# Patient Record
Sex: Female | Born: 1965 | Race: White | Hispanic: No | Smoking: Current every day smoker
Health system: Southern US, Community
[De-identification: ages and names within clinical notes are randomized; demographics above are authoritative.]

## PROBLEM LIST (undated history)

## (undated) DIAGNOSIS — Z789 Other specified health status: Secondary | ICD-10-CM

## (undated) DIAGNOSIS — F329 Major depressive disorder, single episode, unspecified: Secondary | ICD-10-CM

## (undated) DIAGNOSIS — F32A Depression, unspecified: Secondary | ICD-10-CM

## (undated) DIAGNOSIS — F419 Anxiety disorder, unspecified: Secondary | ICD-10-CM

## (undated) HISTORY — DX: Depression, unspecified: F32.A

## (undated) HISTORY — PX: DIAGNOSTIC LAPAROSCOPY: SUR761

## (undated) HISTORY — PX: TUBAL LIGATION: SHX77

## (undated) HISTORY — DX: Anxiety disorder, unspecified: F41.9

---
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
## 1997-10-16 ENCOUNTER — Inpatient Hospital Stay (HOSPITAL_COMMUNITY): Admission: AD | Admit: 1997-10-16 | Discharge: 1997-10-16 | Payer: Self-pay | Admitting: Obstetrics and Gynecology

## 1997-12-27 ENCOUNTER — Ambulatory Visit (HOSPITAL_COMMUNITY): Admission: RE | Admit: 1997-12-27 | Discharge: 1997-12-27 | Payer: Self-pay | Admitting: Obstetrics and Gynecology

## 1997-12-31 ENCOUNTER — Inpatient Hospital Stay (HOSPITAL_COMMUNITY): Admission: AD | Admit: 1997-12-31 | Discharge: 1998-01-04 | Payer: Self-pay | Admitting: Obstetrics and Gynecology

## 1999-02-24 ENCOUNTER — Other Ambulatory Visit: Admission: RE | Admit: 1999-02-24 | Discharge: 1999-02-24 | Payer: Self-pay | Admitting: Obstetrics and Gynecology

## 2000-03-08 ENCOUNTER — Other Ambulatory Visit: Admission: RE | Admit: 2000-03-08 | Discharge: 2000-03-08 | Payer: Self-pay | Admitting: Obstetrics and Gynecology

## 2001-04-11 ENCOUNTER — Other Ambulatory Visit: Admission: RE | Admit: 2001-04-11 | Discharge: 2001-04-11 | Payer: Self-pay | Admitting: Obstetrics and Gynecology

## 2002-04-24 ENCOUNTER — Other Ambulatory Visit: Admission: RE | Admit: 2002-04-24 | Discharge: 2002-04-24 | Payer: Self-pay | Admitting: Obstetrics and Gynecology

## 2003-05-07 ENCOUNTER — Other Ambulatory Visit: Admission: RE | Admit: 2003-05-07 | Discharge: 2003-05-07 | Payer: Self-pay | Admitting: Obstetrics and Gynecology

## 2004-05-05 ENCOUNTER — Other Ambulatory Visit: Admission: RE | Admit: 2004-05-05 | Discharge: 2004-05-05 | Payer: Self-pay | Admitting: Obstetrics and Gynecology

## 2004-07-13 ENCOUNTER — Ambulatory Visit (HOSPITAL_COMMUNITY): Admission: RE | Admit: 2004-07-13 | Discharge: 2004-07-13 | Payer: Self-pay | Admitting: Obstetrics and Gynecology

## 2005-05-14 ENCOUNTER — Other Ambulatory Visit: Admission: RE | Admit: 2005-05-14 | Discharge: 2005-05-14 | Payer: Self-pay | Admitting: Obstetrics and Gynecology

## 2010-10-17 ENCOUNTER — Other Ambulatory Visit: Payer: Self-pay | Admitting: Obstetrics and Gynecology

## 2010-10-17 DIAGNOSIS — R928 Other abnormal and inconclusive findings on diagnostic imaging of breast: Secondary | ICD-10-CM

## 2010-10-24 ENCOUNTER — Ambulatory Visit
Admission: RE | Admit: 2010-10-24 | Discharge: 2010-10-24 | Disposition: A | Discharge status: Home / Self Care | Payer: Managed Care, Other (non HMO) | Source: Ambulatory Visit | Attending: Obstetrics and Gynecology | Admitting: Obstetrics and Gynecology

## 2010-10-24 ENCOUNTER — Other Ambulatory Visit: Payer: Self-pay | Admitting: Obstetrics and Gynecology

## 2010-10-24 DIAGNOSIS — R928 Other abnormal and inconclusive findings on diagnostic imaging of breast: Secondary | ICD-10-CM

## 2012-04-26 ENCOUNTER — Encounter (HOSPITAL_COMMUNITY): Payer: Self-pay | Admitting: Pharmacist

## 2012-05-05 NOTE — H&P (Signed)
Terri Lee, Terri Lee                 ACCOUNT NO.:  [id number]  MEDICAL RECORD NO.:  [id number]  LOCATION:  PERIO                         FACILITY:  [facility]  PHYSICIAN:  Duke Salvia. Marcelle Overlie, M.D.DATE OF BIRTH:  1965/09/07  DATE OF ADMISSION:  04/07/2012 DATE OF DISCHARGE:                             HISTORY & PHYSICAL   CHIEF COMPLAINT:  Menorrhagia, endometrial polyp.  HISTORY OF PRESENT ILLNESS:  A 46 year old, G1, P1, prior tubal was evaluated recently with complaints of heavy menstrual flow, FHT performed in our office April 03, 2012, demonstrated a retroverted uterus, very small 9 mm intramural fibroid, but there was also a well- defined 14 x 6 mm polyp.  Presents now for D and C, hysteroscopy with removal of the polyp.  This procedure including risks related to bleeding, infection, other complications may require additional surgery discussed with her which she understands and accepts.  PAST MEDICAL HISTORY:  Current medications:  Prozac.  Allergies:  None.  SURGICAL HISTORY:  C-section in 1999, tubal ligation in 2005.  FAMILY HISTORY:  Significant for diabetes and unspecified cancer.  SOCIAL HISTORY:  Does smoke 1/2 PPD.  Denies drug or alcohol use.  She is married.  Her PCP is Biochemist, clinical at [employer].  PHYSICAL EXAMINATION:  VITAL SIGNS:  Temperature 98.2, blood pressure 120/72. HEENT:  Unremarkable. NECK:  Supple without masses. LUNGS:  Clear. CARDIOVASCULAR:  Regular rate and rhythm without murmurs, rubs, gallops. BREASTS:  Without masses. ABDOMEN:  Soft, flat, nontender. PELVIC:  Normal external genitalia.  Vagina and cervix clear.  Uterus, mid positional size.  Adnexa negative. EXTREMITIES AND NEUROLOGIC:  Unremarkable.  IMPRESSION:  Menorrhagia, endometrial polyp.  PLAN:  D and C, hysteroscopy.  Procedure and risks discussed as above.     [name] M. Marcelle Overlie, M.D.     RM[facility]/MEDQ  D:  05/05/2012  T:  05/05/2012  Job:  [id number]

## 2012-05-05 NOTE — H&P (Signed)
Terri Lee  DICTATION # [id number] CSN# [id number]   Meriel Pica, MD 05/05/2012 9:36 AM

## 2012-05-06 ENCOUNTER — Encounter (HOSPITAL_COMMUNITY)
Admission: RE | Admit: 2012-05-06 | Discharge: 2012-05-06 | Disposition: A | Discharge status: Home / Self Care | Payer: Managed Care, Other (non HMO) | Source: Ambulatory Visit | Attending: Obstetrics and Gynecology | Admitting: Obstetrics and Gynecology

## 2012-05-06 ENCOUNTER — Encounter (HOSPITAL_COMMUNITY): Payer: Self-pay

## 2012-05-06 HISTORY — DX: Other specified health status: Z78.9

## 2012-05-06 LAB — CBC
MCH: 28.5 pg (ref 26.0–34.0)
MCHC: 33.6 g/dL (ref 30.0–36.0)
MCV: 84.9 fL (ref 78.0–100.0)
Platelets: 225 10*3/uL (ref 150–400)
RBC: 4.7 MIL/uL (ref 3.87–5.11)

## 2012-05-06 NOTE — Patient Instructions (Addendum)
20 Terri Lee  05/06/2012   Your procedure is scheduled on:  05/08/12  Enter through the Main Entrance of [facility] at 6 AM.  Pick up the phone at the desk and dial [phone].   Call this number if you have problems the morning of surgery: [phone]   Remember:   Do not eat food:After Midnight.  Do not drink clear liquids: After Midnight.  Take these medicines the morning of surgery with A SIP OF WATER: NA   Do not wear jewelry, make-up or nail polish.  Do not wear lotions, powders, or perfumes. You may wear deodorant.  Do not shave 48 hours prior to surgery.  Do not bring valuables to the hospital.  Contacts, dentures or bridgework may not be worn into surgery.  Leave suitcase in the car. After surgery it may be brought to your room.  For patients admitted to the hospital, checkout time is 11:00 AM the day of discharge.   Patients discharged the day of surgery will not be allowed to drive home.  Name and phone number of your driver: mother   [name]  Special Instructions: Shower using CHG 2 nights before surgery and the night before surgery.  If you shower the day of surgery use CHG.  Use special wash - you have one bottle of CHG for all showers.  You should use approximately 1/3 of the bottle for each shower.   Please read over the following fact sheets that you were given: Surgical Site Infection Prevention

## 2012-05-08 ENCOUNTER — Encounter (HOSPITAL_COMMUNITY)
Admission: RE | Disposition: A | Discharge status: Home / Self Care | Payer: Self-pay | Source: Ambulatory Visit | Attending: Obstetrics and Gynecology

## 2012-05-08 ENCOUNTER — Ambulatory Visit (HOSPITAL_COMMUNITY): Payer: Managed Care, Other (non HMO) | Admitting: Anesthesiology

## 2012-05-08 ENCOUNTER — Ambulatory Visit (HOSPITAL_COMMUNITY)
Admission: RE | Admit: 2012-05-08 | Discharge: 2012-05-08 | Disposition: A | Discharge status: Home / Self Care | Payer: Managed Care, Other (non HMO) | Source: Ambulatory Visit | Attending: Obstetrics and Gynecology | Admitting: Obstetrics and Gynecology

## 2012-05-08 ENCOUNTER — Encounter (HOSPITAL_COMMUNITY): Payer: Self-pay | Admitting: Anesthesiology

## 2012-05-08 ENCOUNTER — Encounter (HOSPITAL_COMMUNITY): Payer: Self-pay | Admitting: *Deleted

## 2012-05-08 DIAGNOSIS — N938 Other specified abnormal uterine and vaginal bleeding: Secondary | ICD-10-CM | POA: Insufficient documentation

## 2012-05-08 DIAGNOSIS — Z01818 Encounter for other preprocedural examination: Secondary | ICD-10-CM | POA: Insufficient documentation

## 2012-05-08 DIAGNOSIS — N840 Polyp of corpus uteri: Secondary | ICD-10-CM | POA: Insufficient documentation

## 2012-05-08 DIAGNOSIS — Z01812 Encounter for preprocedural laboratory examination: Secondary | ICD-10-CM | POA: Insufficient documentation

## 2012-05-08 DIAGNOSIS — N949 Unspecified condition associated with female genital organs and menstrual cycle: Secondary | ICD-10-CM | POA: Insufficient documentation

## 2012-05-08 HISTORY — PX: HYSTEROSCOPY WITH D & C: SHX1775

## 2012-05-08 SURGERY — DILATATION AND CURETTAGE /HYSTEROSCOPY
Anesthesia: General | Site: Uterus | Wound class: Clean Contaminated

## 2012-05-08 MED ORDER — LACTATED RINGERS IV SOLN
INTRAVENOUS | Status: DC
  Administered 2012-05-08 (×2): via INTRAVENOUS

## 2012-05-08 MED ORDER — ONDANSETRON HCL 4 MG/2ML IJ SOLN
INTRAMUSCULAR | Status: DC | PRN
  Administered 2012-05-08: 4 mg via INTRAVENOUS

## 2012-05-08 MED ORDER — DEXAMETHASONE SODIUM PHOSPHATE 4 MG/ML IJ SOLN
INTRAMUSCULAR | Status: DC | PRN
  Administered 2012-05-08: 10 mg via INTRAVENOUS

## 2012-05-08 MED ORDER — DEXAMETHASONE SODIUM PHOSPHATE 10 MG/ML IJ SOLN
INTRAMUSCULAR | Status: AC
  Filled 2012-05-08: qty 1

## 2012-05-08 MED ORDER — SODIUM CHLORIDE 0.9 % IR SOLN
Status: DC | PRN
  Administered 2012-05-08: 3000 mL

## 2012-05-08 MED ORDER — MIDAZOLAM HCL 5 MG/5ML IJ SOLN
INTRAMUSCULAR | Status: DC | PRN
  Administered 2012-05-08: 2 mg via INTRAVENOUS

## 2012-05-08 MED ORDER — DEXTROSE IN LACTATED RINGERS 5 % IV SOLN: INTRAVENOUS | Status: DC

## 2012-05-08 MED ORDER — CEFAZOLIN SODIUM-DEXTROSE 2-3 GM-% IV SOLR
2.0000 g | INTRAVENOUS | Status: AC
  Administered 2012-05-08: 2 g via INTRAVENOUS

## 2012-05-08 MED ORDER — ASPIRIN 81 MG PO CHEW
CHEWABLE_TABLET | ORAL | Status: AC
  Filled 2012-05-08: qty 1

## 2012-05-08 MED ORDER — FENTANYL CITRATE 0.05 MG/ML IJ SOLN
INTRAMUSCULAR | Status: AC
  Administered 2012-05-08: 50 ug via INTRAVENOUS
  Filled 2012-05-08: qty 2

## 2012-05-08 MED ORDER — LIDOCAINE HCL (CARDIAC) 20 MG/ML IV SOLN
INTRAVENOUS | Status: DC | PRN
  Administered 2012-05-08: 50 mg via INTRAVENOUS

## 2012-05-08 MED ORDER — PROPOFOL 10 MG/ML IV EMUL
INTRAVENOUS | Status: AC
  Filled 2012-05-08: qty 20

## 2012-05-08 MED ORDER — MIDAZOLAM HCL 2 MG/2ML IJ SOLN
INTRAMUSCULAR | Status: AC
  Filled 2012-05-08: qty 2

## 2012-05-08 MED ORDER — KETOROLAC TROMETHAMINE 30 MG/ML IJ SOLN
INTRAMUSCULAR | Status: AC
Start: 2012-05-08 — End: 2012-05-08
  Filled 2012-05-08: qty 1

## 2012-05-08 MED ORDER — HYDROCODONE-IBUPROFEN 7.5-200 MG PO TABS: 1.0000 | ORAL_TABLET | Freq: Three times a day (TID) | ORAL | Status: DC | PRN

## 2012-05-08 MED ORDER — LIDOCAINE HCL (CARDIAC) 20 MG/ML IV SOLN
INTRAVENOUS | Status: AC
Start: 2012-05-08 — End: 2012-05-08
  Filled 2012-05-08: qty 5

## 2012-05-08 MED ORDER — LIDOCAINE HCL 1 % IJ SOLN
INTRAMUSCULAR | Status: DC | PRN
  Administered 2012-05-08: 6 mL via INTRADERMAL

## 2012-05-08 MED ORDER — SILVER NITRATE-POT NITRATE 75-25 % EX MISC
CUTANEOUS | Status: AC
  Filled 2012-05-08: qty 1

## 2012-05-08 MED ORDER — FENTANYL CITRATE 0.05 MG/ML IJ SOLN
INTRAMUSCULAR | Status: DC | PRN
  Administered 2012-05-08: 25 ug via INTRAVENOUS
  Administered 2012-05-08: 75 ug via INTRAVENOUS
  Administered 2012-05-08: 50 ug via INTRAVENOUS

## 2012-05-08 MED ORDER — KETOROLAC TROMETHAMINE 30 MG/ML IJ SOLN
INTRAMUSCULAR | Status: DC | PRN
  Administered 2012-05-08: 30 mg via INTRAVENOUS

## 2012-05-08 MED ORDER — FENTANYL CITRATE 0.05 MG/ML IJ SOLN
25.0000 ug | INTRAMUSCULAR | Status: DC | PRN
  Administered 2012-05-08 (×2): 50 ug via INTRAVENOUS

## 2012-05-08 MED ORDER — FENTANYL CITRATE 0.05 MG/ML IJ SOLN
INTRAMUSCULAR | Status: AC
  Filled 2012-05-08: qty 2

## 2012-05-08 MED ORDER — PROPOFOL 10 MG/ML IV EMUL
INTRAVENOUS | Status: DC | PRN
  Administered 2012-05-08: 170 mg via INTRAVENOUS

## 2012-05-08 MED ORDER — ONDANSETRON HCL 4 MG/2ML IJ SOLN
INTRAMUSCULAR | Status: AC
Start: 2012-05-08 — End: 2012-05-08
  Filled 2012-05-08: qty 2

## 2012-05-08 SURGICAL SUPPLY — 23 items
BLADE INCISOR TRUC PLUS 2.9 (ABLATOR) IMPLANT
CANISTER SUCTION 2500CC (MISCELLANEOUS) ×2 IMPLANT
CATH ROBINSON RED A/P 16FR (CATHETERS) ×2 IMPLANT
CLOTH BEACON ORANGE TIMEOUT ST (SAFETY) ×2 IMPLANT
CONTAINER PREFILL 10% NBF 60ML (FORM) ×4 IMPLANT
DILATOR CANAL MILEX (MISCELLANEOUS) ×1 IMPLANT
DRESSING TELFA 8X3 (GAUZE/BANDAGES/DRESSINGS) ×2 IMPLANT
ELECT REM PT RETURN 9FT ADLT (ELECTROSURGICAL)
ELECTRODE REM PT RTRN 9FT ADLT (ELECTROSURGICAL) IMPLANT
GLOVE BIO SURGEON STRL SZ7 (GLOVE) ×4 IMPLANT
GLOVE BIOGEL PI IND STRL 7.0 (GLOVE) IMPLANT
GLOVE BIOGEL PI INDICATOR 7.0 (GLOVE) ×1
GOWN STRL REIN XL XLG (GOWN DISPOSABLE) ×6 IMPLANT
INCISOR TRUC PLUS BLADE 2.9 (ABLATOR) ×2
KIT HYSTEROSCOPY TRUCLEAR (ABLATOR) ×1 IMPLANT
LOOP ANGLED CUTTING 22FR (CUTTING LOOP) IMPLANT
NDL SPNL 22GX3.5 QUINCKE BK (NEEDLE) IMPLANT
NEEDLE SPNL 22GX3.5 QUINCKE BK (NEEDLE) ×2 IMPLANT
PACK HYSTEROSCOPY LF (CUSTOM PROCEDURE TRAY) ×2 IMPLANT
PAD OB MATERNITY 4.3X12.25 (PERSONAL CARE ITEMS) ×2 IMPLANT
SYR CONTROL 10ML LL (SYRINGE) ×1 IMPLANT
TOWEL OR 17X24 6PK STRL BLUE (TOWEL DISPOSABLE) ×4 IMPLANT
WATER STERILE IRR 1000ML POUR (IV SOLUTION) ×2 IMPLANT

## 2012-05-08 NOTE — Op Note (Signed)
Preoperative diagnosis: Abnormal uterine bleeding, endometrial polyp  Postoperative diagnosis: Same  Procedure: Hysteroscopy, resection of endometrial polyp with Tru- clear Surgeon: Marcelle Overlie  Anesthesia: Gen.  Specimens removed: Endometrial polyp fragments, to pathology  Procedure and findings:  The patient taken the operating room after an adequate level of general anesthesia was obtained with the legs in stirrups the perineum and vagina were prepped and draped in usual fashion for D&C appropriate timeout for taken at that point. The bladder was drained EUA was carried out the uterus is upper limit normal size mid posterior adnexa negative. Speculum was positioned cervix grasped with tenaculum paracervical block was then created by infiltrating at 3 and 9:00 submucosally 5-7 cc of 1% Xylocaine at each site after negative aspiration. Initial cervical stenosis was encountered, with small graduated dilators the fundus was identified and progressively dilated to a 2527 Pratt dilator. The to clear 5 mm hysteroscope was inserted a well-defined polyp was noted. This was resected readily a once this was completed the resection piece was removed and careful hysteroscopy revealed that the tubal ostia in the upper fundus were otherwise unremarkable there was minimal bleeding she tolerated this well at the PACU in good condition.  Dictated with [facility]  Terri Lee M. Milana Obey.D.

## 2012-05-08 NOTE — Anesthesia Postprocedure Evaluation (Signed)
Anesthesia Post Note  Patient: Terri Lee  Procedure(s) Performed: Procedure(s) (LRB): DILATATION AND CURETTAGE /HYSTEROSCOPY (N/A)  Anesthesia type: GA  Patient location: PACU  Post pain: Pain level controlled  Post assessment: Post-op Vital signs reviewed  Last Vitals:  Filed Vitals:   05/08/12 0830  BP: 127/77  Pulse: 71  Temp:   Resp: 19    Post vital signs: Reviewed  Level of consciousness: sedated  Complications: No apparent anesthesia complications

## 2012-05-08 NOTE — Anesthesia Procedure Notes (Signed)
Procedure Name: LMA Insertion Date/Time: 05/08/2012 7:33 AM Performed by: Isabella Bowens R Pre-anesthesia Checklist: Patient identified, Emergency Drugs available, Timeout performed, Patient being monitored and Suction available Patient Re-evaluated:Patient Re-evaluated prior to inductionOxygen Delivery Method: Circle system utilized Preoxygenation: Pre-oxygenation with 100% oxygen Intubation Type: IV induction Ventilation: Mask ventilation without difficulty LMA: LMA inserted LMA Size: 4.0 Grade View: Grade II Number of attempts: 1 Placement Confirmation: positive ETCO2 and breath sounds checked- equal and bilateral Dental Injury: Teeth and Oropharynx as per pre-operative assessment

## 2012-05-08 NOTE — Anesthesia Preprocedure Evaluation (Signed)
Anesthesia Evaluation  Patient identified by MRN, date of birth, ID band Patient awake    Reviewed: Allergy & Precautions, H&P , Patient's Chart, lab work & pertinent test results, reviewed documented beta blocker date and time   Airway Mallampati: II TM Distance: >3 FB Neck ROM: full    Dental No notable dental hx. (+) Dental Advisory Given,    Pulmonary  breath sounds clear to auscultation  Pulmonary exam normal       Cardiovascular Rhythm:regular Rate:Normal     Neuro/Psych    GI/Hepatic   Endo/Other    Renal/GU      Musculoskeletal   Abdominal   Peds  Hematology   Anesthesia Other Findings   Reproductive/Obstetrics                           Anesthesia Physical Anesthesia Plan  ASA: II  Anesthesia Plan: General   Post-op Pain Management:    Induction: Intravenous  Airway Management Planned: LMA  Additional Equipment:   Intra-op Plan:   Post-operative Plan:   Informed Consent: I have reviewed the patients History and Physical, chart, labs and discussed the procedure including the risks, benefits and alternatives for the proposed anesthesia with the patient or authorized representative who has indicated his/her understanding and acceptance.   Dental Advisory Given  Plan Discussed with: CRNA and Surgeon  Anesthesia Plan Comments: (  Discussed  general anesthesia, including possible nausea, instrumentation of airway, sore throat,pulmonary aspiration, etc. I asked if the were any outstanding questions, or  concerns before we proceeded. )        Anesthesia Quick Evaluation

## 2012-05-08 NOTE — Transfer of Care (Signed)
Immediate Anesthesia Transfer of Care Note  Patient: Terri Lee  Procedure(s) Performed: Procedure(s) (LRB) with comments: DILATATION AND CURETTAGE /HYSTEROSCOPY (N/A) - with Truclear  Patient Location: PACU  Anesthesia Type: General  Level of Consciousness: awake, alert  and oriented  Airway & Oxygen Therapy: Patient Spontanous Breathing and Patient connected to nasal cannula oxygen  Post-op Assessment: Report given to PACU RN and Post -op Vital signs reviewed and stable  Post vital signs: Reviewed and stable  Complications: No apparent anesthesia complications

## 2012-05-08 NOTE — Progress Notes (Signed)
The patient was re-examined with no change in status 

## 2012-05-09 ENCOUNTER — Encounter (HOSPITAL_COMMUNITY): Payer: Self-pay | Admitting: Obstetrics and Gynecology

## 2015-01-12 ENCOUNTER — Ambulatory Visit (INDEPENDENT_AMBULATORY_CARE_PROVIDER_SITE_OTHER): Payer: Managed Care, Other (non HMO) | Admitting: Podiatry

## 2015-01-12 ENCOUNTER — Ambulatory Visit (INDEPENDENT_AMBULATORY_CARE_PROVIDER_SITE_OTHER): Payer: Managed Care, Other (non HMO)

## 2015-01-12 DIAGNOSIS — R52 Pain, unspecified: Secondary | ICD-10-CM

## 2015-01-12 DIAGNOSIS — M205X2 Other deformities of toe(s) (acquired), left foot: Secondary | ICD-10-CM | POA: Diagnosis not present

## 2015-01-12 NOTE — Progress Notes (Signed)
   Subjective:    Patient ID: Terri Lee, female    DOB: 1966-06-24, 49 y.o.   MRN: [id number]  HPI  N-SORE L-LT FOOT 5TH MET. D-1 MONTH O-SLOWLY C-GRADUALLY A-PRESSURE T-NONE  Patient denies any direct injury or change of activity. She also mentions ongoing bilateral inferior heel pain which we have treated in the past which is chronic, however, has slightly from conservative care.  Patient has weightbearing manufacturing job  Review of Systems  All other systems reviewed and are negative.      Objective:   Physical Exam  Orientated 3  Vascular: DP and PT pulses 2/4 bilaterally Capillary reflex immediate bilaterally No peripheral edema noted bilaterally  Neurological: Sensation to 10 g monofilament wire intact 5/5 bilaterally Vibratory sensation intact bilaterally Ankle reflex equal and reactive bilaterally  Dermatological: Texture and turgor within normal limits  Musculoskeletal: Prominent fifth MPJ bilaterally Tenderness to lateral pressure fifth MPJ left without any palpable lesions Palpable tenderness medial plantar fascial insertional area bilaterally    X-ray examination weightbearing left foot  Intact bony structure without fracture and/or dislocation Posterior inferior calcaneal spurs Tailor bunion  Radiographic impression: No acute bony abnormality noted left foot       Assessment & Plan:   Assessment: Satisfactory neurovascular status Symptomatic tailor's bunion deformity left Plantar fasciitis bilaterally  Plan: Review the results of examination today and discussed treatment options for tailor's bunions including shoe modification, protective padding around the area and local sterile injection. Patient is opting for local sterile injection. Skin is prepped with alcohol and Betadine and 4 mg of dexamethasone phosphate mixed with 5 mg of plain Marcaine were injected around the dorsal medial plantar left fifth MPJ. Patient tolerated  procedure without any difficulty  Reappoint at patient's request

## 2015-01-13 DIAGNOSIS — M205X2 Other deformities of toe(s) (acquired), left foot: Secondary | ICD-10-CM

## 2015-01-13 MED ORDER — DEXAMETHASONE SODIUM PHOSPHATE 120 MG/30ML IJ SOLN
4.0000 mg | Freq: Once | INTRAMUSCULAR | Status: AC
  Administered 2015-01-13: 4 mg via INTRA_ARTICULAR

## 2015-02-15 ENCOUNTER — Other Ambulatory Visit: Payer: Self-pay | Admitting: Physician Assistant

## 2015-10-12 DIAGNOSIS — K219 Gastro-esophageal reflux disease without esophagitis: Secondary | ICD-10-CM | POA: Insufficient documentation

## 2015-10-12 DIAGNOSIS — F329 Major depressive disorder, single episode, unspecified: Secondary | ICD-10-CM | POA: Insufficient documentation

## 2015-10-12 DIAGNOSIS — F32A Depression, unspecified: Secondary | ICD-10-CM | POA: Insufficient documentation

## 2016-01-24 DIAGNOSIS — M1811 Unilateral primary osteoarthritis of first carpometacarpal joint, right hand: Secondary | ICD-10-CM | POA: Insufficient documentation

## 2016-03-19 DIAGNOSIS — G5601 Carpal tunnel syndrome, right upper limb: Secondary | ICD-10-CM | POA: Insufficient documentation

## 2016-09-04 DIAGNOSIS — M25561 Pain in right knee: Secondary | ICD-10-CM | POA: Insufficient documentation

## 2017-10-30 ENCOUNTER — Other Ambulatory Visit: Payer: Self-pay | Admitting: Physician Assistant

## 2018-05-23 DIAGNOSIS — S92309A Fracture of unspecified metatarsal bone(s), unspecified foot, initial encounter for closed fracture: Secondary | ICD-10-CM | POA: Insufficient documentation

## 2018-05-23 DIAGNOSIS — M7061 Trochanteric bursitis, right hip: Secondary | ICD-10-CM | POA: Insufficient documentation

## 2018-08-07 ENCOUNTER — Other Ambulatory Visit: Payer: Self-pay | Admitting: Physician Assistant

## 2018-10-02 ENCOUNTER — Encounter: Payer: Self-pay | Admitting: Podiatry

## 2018-10-02 ENCOUNTER — Ambulatory Visit (INDEPENDENT_AMBULATORY_CARE_PROVIDER_SITE_OTHER): Payer: BLUE CROSS/BLUE SHIELD | Admitting: Podiatry

## 2018-10-02 ENCOUNTER — Ambulatory Visit (INDEPENDENT_AMBULATORY_CARE_PROVIDER_SITE_OTHER): Payer: BLUE CROSS/BLUE SHIELD

## 2018-10-02 DIAGNOSIS — M722 Plantar fascial fibromatosis: Secondary | ICD-10-CM | POA: Diagnosis not present

## 2018-10-02 MED ORDER — METHYLPREDNISOLONE 4 MG PO TBPK: ORAL_TABLET | ORAL | 0 refills | Status: DC

## 2018-10-02 MED ORDER — MELOXICAM 15 MG PO TABS: 15.0000 mg | ORAL_TABLET | Freq: Every day | ORAL | 3 refills | Status: AC

## 2018-10-02 NOTE — Patient Instructions (Signed)

## 2018-10-02 NOTE — Progress Notes (Signed)
Subjective:  Patient ID: Terri Lee, female    DOB: 1966-03-02,  MRN: [id number] HPI Chief Complaint  Patient presents with  . Foot Pain    Plantar heel bilateral - aching x years intermittently, now pain is constant, AM pain, has orthotics she still wears, wondering if she needs new orthotics  . New Patient (Initial Visit)    Est pt 12/2014    53 y.o. female presents with the above complaint.   ROS: Denies fever chills nausea vomiting muscle aches pains calf pain back pain chest pain shortness of breath.  Past Medical History:  Diagnosis Date  . No pertinent past medical history    Past Surgical History:  Procedure Laterality Date  . CESAREAN SECTION    . DIAGNOSTIC LAPAROSCOPY    . HYSTEROSCOPY W/D&C  05/08/2012   Procedure: DILATATION AND CURETTAGE /HYSTEROSCOPY;  Surgeon: Meriel Pica, MD;  Location: [facility];  Service: Gynecology;  Laterality: N/A;  with Truclear  . TUBAL LIGATION      Current Outpatient Medications:  .  esomeprazole (NEXIUM) 20 MG packet, Take by mouth., Disp: , Rfl:  .  aspirin-acetaminophen-caffeine (EXCEDRIN MIGRAINE) 250-250-65 MG per tablet, Take 1 tablet by mouth daily as needed. For migraine not relieved by Ibuprofen, Disp: , Rfl:  .  FLUoxetine (PROZAC) 20 MG capsule, , Disp: , Rfl:  .  ibuprofen (ADVIL,MOTRIN) 200 MG tablet, Take 400 mg by mouth 2 (two) times daily as needed. For headache, Disp: , Rfl:  .  LORazepam (ATIVAN) 0.5 MG tablet, Take 0.5 mg by mouth daily., Disp: , Rfl: 5 .  meloxicam (MOBIC) 15 MG tablet, Take 1 tablet (15 mg total) by mouth daily., Disp: 30 tablet, Rfl: 3 .  methylPREDNISolone (MEDROL DOSEPAK) 4 MG TBPK tablet, 6 day dose pack - take as directed, Disp: 21 tablet, Rfl: 0  No Known Allergies Review of Systems Objective:  There were no vitals filed for this visit.  General: Well developed, nourished, in no acute distress, alert and oriented x3   Dermatological: Skin is warm, dry and supple bilateral. Nails x 10  are well maintained; remaining integument appears unremarkable at this time. There are no open sores, no preulcerative lesions, no rash or signs of infection present.  Vascular: Dorsalis Pedis artery and Posterior Tibial artery pedal pulses are 2/4 bilateral with immedate capillary fill time. Pedal hair growth present. No varicosities and no lower extremity edema present bilateral.   Neruologic: Grossly intact via light touch bilateral. Vibratory intact via tuning fork bilateral. Protective threshold with Semmes Wienstein monofilament intact to all pedal sites bilateral. Patellar and Achilles deep tendon reflexes 2+ bilateral. No Babinski or clonus noted bilateral.   Musculoskeletal: No gross boney pedal deformities bilateral. No pain, crepitus, or limitation noted with foot and ankle range of motion bilateral. Muscular strength 5/5 in all groups tested bilateral.  Pain on palpation medial calcaneal tubercles bilateral.  Gait: Unassisted, Nonantalgic.    Radiographs:  Radiographs demonstrate plantar distal aorta calcaneal spur soft tissue increase in density plantar fascial pain insertion sites bilateral.  Assessment & Plan:   Assessment: Plantar fasciitis bilateral.  Plan: Discussed etiology pathology conservative versus surgical therapies after sterile Betadine skin prep I injected 20 mg Kenalog 5 mg Marcaine for maximal tenderness medial aspect of bilateral heel.  Tolerated procedure well without complications.  Starting her on a Medrol Dosepak to be followed by meloxicam.  Also discussed appropriate shoe gear stretching exercises ice therapy sugar modifications and dispensed foot to plantar fascial braces.  Garrel Ridgel, DPM

## 2018-10-22 ENCOUNTER — Other Ambulatory Visit: Payer: BLUE CROSS/BLUE SHIELD | Admitting: Orthotics

## 2018-11-06 ENCOUNTER — Ambulatory Visit: Payer: BLUE CROSS/BLUE SHIELD | Admitting: Podiatry

## 2018-12-02 ENCOUNTER — Ambulatory Visit: Payer: BLUE CROSS/BLUE SHIELD | Admitting: Podiatry

## 2018-12-02 ENCOUNTER — Other Ambulatory Visit: Payer: BLUE CROSS/BLUE SHIELD | Admitting: Orthotics

## 2018-12-09 ENCOUNTER — Encounter: Payer: Self-pay | Admitting: Podiatry

## 2018-12-09 ENCOUNTER — Ambulatory Visit (INDEPENDENT_AMBULATORY_CARE_PROVIDER_SITE_OTHER): Payer: BLUE CROSS/BLUE SHIELD | Admitting: Podiatry

## 2018-12-09 ENCOUNTER — Ambulatory Visit (INDEPENDENT_AMBULATORY_CARE_PROVIDER_SITE_OTHER): Payer: BLUE CROSS/BLUE SHIELD | Admitting: Orthotics

## 2018-12-09 ENCOUNTER — Other Ambulatory Visit: Payer: Self-pay

## 2018-12-09 DIAGNOSIS — M722 Plantar fascial fibromatosis: Secondary | ICD-10-CM | POA: Diagnosis not present

## 2018-12-09 DIAGNOSIS — M1811 Unilateral primary osteoarthritis of first carpometacarpal joint, right hand: Secondary | ICD-10-CM

## 2018-12-09 DIAGNOSIS — G5601 Carpal tunnel syndrome, right upper limb: Secondary | ICD-10-CM

## 2018-12-09 DIAGNOSIS — M25561 Pain in right knee: Secondary | ICD-10-CM

## 2018-12-09 NOTE — Progress Notes (Signed)

## 2018-12-10 ENCOUNTER — Encounter: Payer: Self-pay | Admitting: Podiatry

## 2018-12-10 NOTE — Progress Notes (Signed)
She presented today for follow-up of her plantar fasciitis.  She states that she was doing better approximately 85% and now has started to regress again.  She was supposed to see Raiford Noble today to have orthotics scanned.  Objective: Vital signs stable alert oriented x3 pain to palpation mid calcaneal tubercles.  Pulses are palpable neurological service intact.  Assessment: Resolving plantar fasciitis with recalcitrant features.  Plan: She is a candidate for orthotics I reinjected the bilateral heels with 20 mg Kenalog 5 mg Marcaine after sterile Betadine skin prep she is to continue the use of her meloxicam.

## 2018-12-30 ENCOUNTER — Other Ambulatory Visit: Payer: Self-pay

## 2018-12-30 ENCOUNTER — Ambulatory Visit: Payer: BC Managed Care – PPO | Admitting: Orthotics

## 2018-12-30 DIAGNOSIS — M25561 Pain in right knee: Secondary | ICD-10-CM

## 2018-12-30 DIAGNOSIS — M1811 Unilateral primary osteoarthritis of first carpometacarpal joint, right hand: Secondary | ICD-10-CM

## 2018-12-30 DIAGNOSIS — M722 Plantar fascial fibromatosis: Secondary | ICD-10-CM

## 2018-12-30 NOTE — Progress Notes (Signed)
Patient came in today to pick up custom made foot orthotics.  The goals were accomplished and the patient reported no dissatisfaction with said orthotics.  Patient was advised of breakin period and how to report any issues. 

## 2019-01-20 ENCOUNTER — Ambulatory Visit (INDEPENDENT_AMBULATORY_CARE_PROVIDER_SITE_OTHER): Payer: BC Managed Care – PPO | Admitting: Podiatry

## 2019-01-20 ENCOUNTER — Encounter: Payer: Self-pay | Admitting: Podiatry

## 2019-01-20 ENCOUNTER — Other Ambulatory Visit: Payer: Self-pay

## 2019-01-20 VITALS — Temp 97.9°F

## 2019-01-20 DIAGNOSIS — M722 Plantar fascial fibromatosis: Secondary | ICD-10-CM | POA: Diagnosis not present

## 2019-01-21 NOTE — Progress Notes (Signed)
She presents today for follow-up of her bilateral heel pain.  She states that they are doing much better approximately 50% continues to wear her orthotics regularly.  She loves her orthotics.  Objective: Vital signs are stable alert and oriented x3.  Pulses are palpable.  Neurologic sensorium is intact deep tendon reflexes are intact muscle strength is normal symmetrical.  Orthopedic evaluation is within normal limits she does have some tenderness on palpation medial calcaneal tubercle of the right heel over the left.  Assessment: Slowly resolving plantar fasciitis bilateral.  Plan: We injected the bilateral heels today 20 mg Kenalog 5 mg Marcaine point maximal tenderness.  Tolerated procedure well without complications.  Follow-up with her in 6 weeks if necessary.  May need to consider surgical intervention.

## 2019-03-03 ENCOUNTER — Encounter: Payer: Self-pay | Admitting: Podiatry

## 2019-03-03 ENCOUNTER — Ambulatory Visit (INDEPENDENT_AMBULATORY_CARE_PROVIDER_SITE_OTHER): Payer: BC Managed Care – PPO | Admitting: Podiatry

## 2019-03-03 ENCOUNTER — Other Ambulatory Visit: Payer: Self-pay

## 2019-03-03 VITALS — Temp 98.0°F

## 2019-03-03 DIAGNOSIS — M722 Plantar fascial fibromatosis: Secondary | ICD-10-CM

## 2019-03-03 NOTE — Progress Notes (Signed)
She presents today's for follow-up of her bilateral heel pain.  States that the bottom of both of her feet still hurt her right foot is worse than the left the last injection you gave me really hurt and it did not help much at all.  Objective: Vital signs are stable alert and oriented x3.  She still has pain on palpation medial calcaneal tubercle right.  Assessment: Chronic intractable plantar fasciitis right.  Plan: At this point she does not want to have surgery until later in October if necessary then so at this point she would like to try EPAT

## 2019-03-16 ENCOUNTER — Other Ambulatory Visit: Payer: BC Managed Care – PPO

## 2019-03-23 ENCOUNTER — Ambulatory Visit: Payer: Self-pay

## 2019-03-23 ENCOUNTER — Other Ambulatory Visit: Payer: Self-pay

## 2019-03-23 DIAGNOSIS — M79676 Pain in unspecified toe(s): Secondary | ICD-10-CM

## 2019-03-23 DIAGNOSIS — M722 Plantar fascial fibromatosis: Secondary | ICD-10-CM

## 2019-03-30 ENCOUNTER — Ambulatory Visit: Payer: Self-pay

## 2019-03-30 ENCOUNTER — Other Ambulatory Visit: Payer: Self-pay

## 2019-03-30 DIAGNOSIS — M722 Plantar fascial fibromatosis: Secondary | ICD-10-CM

## 2019-03-30 DIAGNOSIS — M79676 Pain in unspecified toe(s): Secondary | ICD-10-CM

## 2019-04-07 ENCOUNTER — Other Ambulatory Visit: Payer: Self-pay

## 2019-04-07 ENCOUNTER — Ambulatory Visit: Payer: Self-pay

## 2019-04-07 DIAGNOSIS — M79676 Pain in unspecified toe(s): Secondary | ICD-10-CM

## 2019-04-07 DIAGNOSIS — M722 Plantar fascial fibromatosis: Secondary | ICD-10-CM

## 2019-04-23 ENCOUNTER — Other Ambulatory Visit: Payer: Self-pay

## 2019-04-23 ENCOUNTER — Ambulatory Visit: Payer: Self-pay

## 2019-04-23 DIAGNOSIS — M722 Plantar fascial fibromatosis: Secondary | ICD-10-CM

## 2019-04-23 DIAGNOSIS — M79676 Pain in unspecified toe(s): Secondary | ICD-10-CM

## 2019-04-24 NOTE — Progress Notes (Signed)
Patient is here today with complaint of right heel pain, that has been ongoing for several months, she is tried injections, stretching, plantar fascial brace, icing and supportive shoes.  She is not had any relief thus far.  ESWT administered for 5 J and tolerated well.  Advised against NSAIDs and ice, and utilizing boot or supportive shoe.  She is to follow-up next week for second treatment.

## 2019-04-24 NOTE — Progress Notes (Signed)
Patient is here today with complaint of right heel pain, that has been ongoing for several months, she is tried injections, stretching, plantar fascial brace, icing and supportive shoes.  She is not had any relief thus far.  ESWT administered for 7 J and tolerated well.  Advised against NSAIDs and ice, and utilizing boot or supportive shoe.  She is to follow-up next week for 3rd treatment.

## 2019-04-30 NOTE — Progress Notes (Signed)
Patient is here today with complaint of right heel pain, that has been ongoing for several months, she is tried injections, stretching, plantar fascial brace, icing and supportive shoes.  She is not had any relief thus far.  ESWT administered for 8 J and tolerated well.  Advised against NSAIDs and ice, and utilizing boot or supportive shoe.  She is to follow-up next week for 3rd treatment.

## 2019-05-05 NOTE — Progress Notes (Signed)
Patient is here today with complaint of right heel pain, that has been ongoing for several months, she is tried injections, stretching, plantar fascial brace, icing and supportive shoes.  She is not had any relief thus far.  ESWT administered for 9 J and tolerated well.  Advised against NSAIDs and ice, and utilizing boot or supportive shoe.  She is to follow-up next week for 4th treatment.

## 2019-05-25 ENCOUNTER — Encounter: Payer: Self-pay | Admitting: Neurology

## 2019-05-25 ENCOUNTER — Other Ambulatory Visit: Payer: Self-pay

## 2019-05-25 ENCOUNTER — Ambulatory Visit (INDEPENDENT_AMBULATORY_CARE_PROVIDER_SITE_OTHER): Payer: BC Managed Care – PPO | Admitting: Neurology

## 2019-05-25 VITALS — BP 129/89 | HR 78 | Temp 98.3°F | Ht 70.0 in | Wt 223.0 lb

## 2019-05-25 DIAGNOSIS — R42 Dizziness and giddiness: Secondary | ICD-10-CM | POA: Diagnosis not present

## 2019-05-25 DIAGNOSIS — H9190 Unspecified hearing loss, unspecified ear: Secondary | ICD-10-CM

## 2019-05-25 DIAGNOSIS — H9193 Unspecified hearing loss, bilateral: Secondary | ICD-10-CM

## 2019-05-25 DIAGNOSIS — A881 Epidemic vertigo: Secondary | ICD-10-CM | POA: Diagnosis not present

## 2019-05-25 DIAGNOSIS — H538 Other visual disturbances: Secondary | ICD-10-CM | POA: Diagnosis not present

## 2019-05-25 DIAGNOSIS — H8110 Benign paroxysmal vertigo, unspecified ear: Secondary | ICD-10-CM

## 2019-05-25 NOTE — Progress Notes (Signed)
[facility]    Provider:  Dr Lucia Gaskins Requesting Provider: Richmond Campbell., PA-C Primary Care Provider:  Richmond Campbell., PA-C  CC:  Vertigo  HPI:  Terri Lee is a 53 y.o. female here as requested by Richmond Campbell., PA-C for vertigo. She went to the hospital for vertigo, epley maneuvers made it better but while she was doing it, it was terrible. She has episodes of dizziness or lightheaded at any time. She has them sporadically every day. Walking or washing dishes, anything, She has never had orthostatic. The episode of vertigo that went to the ED, she was dizzy, happened when getting up out of bead, when she got up the room spinned or she spinned. That morning it was also bad, movement made it worse. Lasted for hours it was severe, dizziness, had nausea but no vomiting, no loss of consciousness or alteration of awareness. She is having blurry vision. Also hearing loss, if turning away from people can't really hear them needs to watch lips. No other focal neurologic deficits, associated symptoms, inciting events or modifiable factors.  Reviewed notes, labs and imaging from outside physicians, which showed:  CBC normal. Reviewed Dr. Marzetta Board notes: Patient was seen for vertigo. Exam non focal. Epley maneuvers were performed with improvement. She was discharged with f/u with pcp. No imaging was ordered. Will request ED notes (not in our system).   Review of Systems: Patient complains of symptoms per HPI as well as the following symptoms anxiety, gerd. Pertinent negatives and positives per HPI. All others negative.   Social History   Socioeconomic History  . Marital status: Divorced    Spouse name: Not on file  . Number of children: 1  . Years of education: Not on file  . Highest education level: Not on file  Occupational History  . Not on file  Social Needs  . Financial resource strain: Not on file  . Food insecurity    Worry: Not on file    Inability: Not  on file  . Transportation needs    Medical: Not on file    Non-medical: Not on file  Tobacco Use  . Smoking status: Current Every Day Smoker    Packs/day: 0.50    Types: Cigarettes  . Smokeless tobacco: Never Used  Substance and Sexual Activity  . Alcohol use: Yes    Comment: rarely  . Drug use: No  . Sexual activity: Not on file  Lifestyle  . Physical activity    Days per week: Not on file    Minutes per session: Not on file  . Stress: Not on file  Relationships  . Social Musician on phone: Not on file    Gets together: Not on file    Attends religious service: Not on file    Active member of club or organization: Not on file    Attends meetings of clubs or organizations: Not on file    Relationship status: Not on file  . Intimate partner violence    Fear of current or ex partner: Not on file    Emotionally abused: Not on file    Physically abused: Not on file    Forced sexual activity: Not on file  Other Topics Concern  . Not on file  Social History Narrative   Lives alone   Right handed    Family History  Problem Relation Age of Onset  . Diabetes Father     Past Medical History:  Diagnosis  Date  . Anxiety   . Depression   . No pertinent past medical history     Patient Active Problem List   Diagnosis Date Noted  . Vertigo 05/25/2019  . Trochanteric bursitis of right hip 05/23/2018  . Acute pain of right knee 09/04/2016  . Carpal tunnel syndrome of right wrist 03/19/2016  . Primary osteoarthritis of first carpometacarpal joint of right hand 01/24/2016  . Depression 10/12/2015  . GERD (gastroesophageal reflux disease) 10/12/2015    Past Surgical History:  Procedure Laterality Date  . CESAREAN SECTION    . DIAGNOSTIC LAPAROSCOPY    . HYSTEROSCOPY W/D&C  05/08/2012   Procedure: DILATATION AND CURETTAGE /HYSTEROSCOPY;  Surgeon: Margarette Asal, MD;  Location: [facility];  Service: Gynecology;  Laterality: N/A;  with Truclear  . TUBAL  LIGATION      Current Outpatient Medications  Medication Sig Dispense Refill  . aspirin-acetaminophen-caffeine (EXCEDRIN MIGRAINE) 250-250-65 MG per tablet Take 1 tablet by mouth daily as needed. For migraine not relieved by Ibuprofen    . ibuprofen (ADVIL,MOTRIN) 200 MG tablet Take 400 mg by mouth 2 (two) times daily as needed. For headache    . LORazepam (ATIVAN) 0.5 MG tablet Take 0.5 mg by mouth daily as needed.   5  . pantoprazole (PROTONIX) 40 MG tablet Take 40 mg by mouth daily.    Marland Kitchen FLUoxetine (PROZAC) 10 MG capsule Take by mouth daily.    . meloxicam (MOBIC) 15 MG tablet Take 1 tablet (15 mg total) by mouth daily. (Patient not taking: Reported on 05/25/2019) 30 tablet 3   No current facility-administered medications for this visit.     Allergies as of 05/25/2019  . (No Known Allergies)    Vitals: BP 129/89 (BP Location: Right Arm, Patient Position: Sitting)   Pulse 78   Temp 98.3 F (36.8 C) Comment: taken at front door  Ht 5\' 10"  (1.778 m)   Wt 223 lb (101.2 kg)   BMI 32.00 kg/m  Last Weight:  Wt Readings from Last 1 Encounters:  05/25/19 223 lb (101.2 kg)   Last Height:   Ht Readings from Last 1 Encounters:  05/25/19 5\' 10"  (1.778 m)     Physical exam: Exam: Gen: NAD, conversant, well nourised, obese, well groomed                     CV: RRR, no MRG. No Carotid Bruits. + mild peripheral edema around right > left ankle, warm, nontender Eyes: Conjunctivae clear without exudates or hemorrhage  Neuro: Detailed Neurologic Exam  Speech:    Speech is normal; fluent and spontaneous with normal comprehension.  Cognition:    The patient is oriented to person, place, and time;     recent and remote memory intact;     language fluent;     normal attention, concentration,     fund of knowledge Cranial Nerves:    The pupils are equal, round, and reactive to light. The fundi are flat.  Visual fields are full to finger confrontation. Extraocular movements are  intact. Trigeminal sensation is intact and the muscles of mastication are normal. The face is symmetric. The palate elevates in the midline. Hearing intact. Voice is normal. Shoulder shrug is normal. The tongue has normal motion without fasciculations.   Coordination:    No dysmetria or ataxia  Gait:    Heel-toe and tandem gait are normal.   Motor Observation:    No asymmetry, no atrophy, and no involuntary movements  noted. Tone:    Normal muscle tone.    Posture:    Posture is normal. normal erect    Strength:    Strength is V/V in the upper and lower limbs.      Sensation: intact to LT     Reflex Exam:  DTR's:    Absent AJs. Deep tendon reflexes in the upper and lower extremities are symmetrical bilaterally.   Toes:    The toes are downgoing bilaterally.   Clonus:    Clonus is absent.    Assessment/Plan:  Very nice 53 year old with vertigo sounds like BPPV but given other concerning symptoms (recurrent vertigo, vision and hearing changes) need MRi brain to eval for space-occupying lesion, demyelination, schwannoma, stroke or other. Also vestibular therapy. Consider ENT referral is not improved. Check orthostatics today in the office. Neuro exam normal.  Orders Placed This Encounter  Procedures  . MR BRAIN W WO CONTRAST  . Basic Metabolic Panel  . Ambulatory referral to Physical Therapy    Cc: Richmond CampbellKaplan, Kristen W., PA-C  Naomie DeanAntonia Destina Mantei, MD  [facility] [address] [place], [place] [id number]  Phone [phone] Fax [phone]

## 2019-05-25 NOTE — Patient Instructions (Signed)
Blood work today MRI of the brain (will call to schedule) Vestibular therapy ([facility] will call)   Benign Positional Vertigo Vertigo is the feeling that you or your surroundings are moving when they are not. Benign positional vertigo is the most common form of vertigo. This is usually a harmless condition (benign). This condition is positional. This means that symptoms are triggered by certain movements and positions. This condition can be dangerous if it occurs while you are doing something that could cause harm to you or others. This includes activities such as driving or operating machinery. What are the causes? In many cases, the cause of this condition is not known. It may be caused by a disturbance in an area of the inner ear that helps your brain to sense movement and balance. This disturbance can be caused by:  Viral infection (labyrinthitis).  Head injury.  Repetitive motion, such as jumping, dancing, or running.  Unknown What increases the risk? You are more likely to develop this condition if:  You are a woman.  You are 55 years of age or older. What are the signs or symptoms? Symptoms of this condition usually happen when you move your head or your eyes in different directions. Symptoms may start suddenly, and usually last for less than a minute. They include:  Loss of balance and falling.  Feeling like you are spinning or moving.  Feeling like your surroundings are spinning or moving.  Nausea and vomiting.  Blurred vision.  Dizziness.  Involuntary eye movement (nystagmus). Symptoms can be mild and cause only minor problems, or they can be severe and interfere with daily life. Episodes of benign positional vertigo may return (recur) over time. Symptoms may improve over time. How is this diagnosed? This condition may be diagnosed based on:  Your medical history.  Physical exam of the head, neck, and ears.  Tests, such as: ? MRI. ? CT scan. ? Eye  movement tests. Your health care provider may ask you to change positions quickly while he or she watches you for symptoms of benign positional vertigo, such as nystagmus. Eye movement may be tested with a variety of exams that are designed to evaluate or stimulate vertigo. ? An electroencephalogram (EEG). This records electrical activity in your brain. ? Hearing tests. You may be referred to a health care provider who specializes in ear, nose, and throat (ENT) problems (otolaryngologist) or a provider who specializes in disorders of the nervous system (neurologist). How is this treated?  This condition may be treated in a session in which your health care provider moves your head in specific positions to adjust your inner ear back to normal. Treatment for this condition may take several sessions. Surgery may be needed in severe cases, but this is rare. In some cases, benign positional vertigo may resolve on its own in 2-4 weeks. Follow these instructions at home: Safety  Move slowly. Avoid sudden body or head movements or certain positions, as told by your health care provider.  Avoid driving until your health care provider says it is safe for you to do so.  Avoid operating heavy machinery until your health care provider says it is safe for you to do so.  Avoid doing any tasks that would be dangerous to you or others if vertigo occurs.  If you have trouble walking or keeping your balance, try using a cane for stability. If you feel dizzy or unstable, sit down right away.  Return to your normal activities as told by  your health care provider. Ask your health care provider what activities are safe for you. General instructions  Take over-the-counter and prescription medicines only as told by your health care provider.  Drink enough fluid to keep your urine pale yellow.  Keep all follow-up visits as told by your health care provider. This is important. Contact a health care provider if:   You have a fever.  Your condition gets worse or you develop new symptoms.  Your family or friends notice any behavioral changes.  You have nausea or vomiting that gets worse.  You have numbness or a "pins and needles" sensation. Get help right away if you:  Have difficulty speaking or moving.  Are always dizzy.  Faint.  Develop severe headaches.  Have weakness in your legs or arms.  Have changes in your hearing or vision.  Develop a stiff neck.  Develop sensitivity to light. Summary  Vertigo is the feeling that you or your surroundings are moving when they are not. Benign positional vertigo is the most common form of vertigo.  The cause of this condition is not known. It may be caused by a disturbance in an area of the inner ear that helps your brain to sense movement and balance.  Symptoms include loss of balance and falling, feeling that you or your surroundings are moving, nausea and vomiting, and blurred vision.  This condition can be diagnosed based on symptoms, physical exam, and other tests, such as MRI, CT scan, eye movement tests, and hearing tests.  Follow safety instructions as told by your health care provider. You will also be told when to contact your health care provider in case of problems. This information is not intended to replace advice given to you by your health care provider. Make sure you discuss any questions you have with your health care provider. Document Released: 04/23/2006 Document Revised: 12/25/2017 Document Reviewed: 12/25/2017 Elsevier Patient Education  2020 [employer].

## 2019-05-26 ENCOUNTER — Telehealth: Payer: Self-pay | Admitting: *Deleted

## 2019-05-26 LAB — BASIC METABOLIC PANEL
BUN/Creatinine Ratio: 13 (ref 9–23)
BUN: 13 mg/dL (ref 6–24)
CO2: 24 mmol/L (ref 20–29)
Calcium: 9.4 mg/dL (ref 8.7–10.2)
Chloride: 105 mmol/L (ref 96–106)
Creatinine, Ser: 0.97 mg/dL (ref 0.57–1.00)
GFR calc Af Amer: 76 mL/min/{1.73_m2} (ref >59)
GFR calc non Af Amer: 66 mL/min/{1.73_m2} (ref >59)
Glucose: 82 mg/dL (ref 65–99)
Potassium: 4.4 mmol/L (ref 3.5–5.2)
Sodium: 142 mmol/L (ref 134–144)

## 2019-05-26 NOTE — Telephone Encounter (Signed)
Called pt & LVM with office number asking for call back. When she calls back, please let her know her labs are normal.

## 2019-05-26 NOTE — Telephone Encounter (Signed)
-----   Message from Melvenia Beam, MD sent at 05/26/2019  9:26 AM EDT ----- normal

## 2019-05-26 NOTE — Telephone Encounter (Signed)
Pt called back and the message from RN was relayed to pt.

## 2019-05-28 ENCOUNTER — Telehealth: Payer: Self-pay | Admitting: Neurology

## 2019-05-28 ENCOUNTER — Ambulatory Visit (INDEPENDENT_AMBULATORY_CARE_PROVIDER_SITE_OTHER): Payer: BC Managed Care – PPO | Admitting: Podiatry

## 2019-05-28 ENCOUNTER — Other Ambulatory Visit: Payer: Self-pay

## 2019-05-28 ENCOUNTER — Encounter: Payer: Self-pay | Admitting: Podiatry

## 2019-05-28 DIAGNOSIS — M722 Plantar fascial fibromatosis: Secondary | ICD-10-CM | POA: Diagnosis not present

## 2019-05-28 NOTE — Telephone Encounter (Signed)
Pt returning call please call back °

## 2019-05-28 NOTE — Progress Notes (Signed)
She presents today for follow-up of bilateral heels stiff the right one still really hurts she is completed 3 EPAT sessions to no avail.  She states that I am ready to have surgery if I absolutely have to.  Objective: Vital signs are stable alert and oriented x3.  She has edema to the plantar medial calcaneal tubercle with severe pain on palpation medial tibial tubercle and central calcaneal tubercle.  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  Plan: At this point requesting MRI of the right plantar fascia and heel for surgical evaluation.

## 2019-05-28 NOTE — Telephone Encounter (Signed)
I spoke to the patient she is scheduled at [facility] for 06/03/19 no to the covid questions.

## 2019-05-28 NOTE — Telephone Encounter (Signed)
LVM for pt to call back about scheduling mri  BCBS Auth: [id number] (exp. 05/28/19 to 06/26/19)

## 2019-05-29 ENCOUNTER — Telehealth: Payer: Self-pay | Admitting: *Deleted

## 2019-05-29 DIAGNOSIS — M79671 Pain in right foot: Secondary | ICD-10-CM

## 2019-05-29 NOTE — Telephone Encounter (Signed)
-----   Message from Rip Harbour, [facility] sent at 05/28/2019  3:39 PM EDT ----- Regarding: MRI MRI right heel - evaluate plantar fascial tear right - surgical consideration

## 2019-05-29 NOTE — Telephone Encounter (Signed)
Orders to J. Quintana, RN for pre-cert, faxed to [facility]. 

## 2019-06-03 ENCOUNTER — Other Ambulatory Visit: Payer: Self-pay

## 2019-06-03 ENCOUNTER — Ambulatory Visit: Payer: BC Managed Care – PPO

## 2019-06-03 DIAGNOSIS — H9190 Unspecified hearing loss, unspecified ear: Secondary | ICD-10-CM

## 2019-06-03 DIAGNOSIS — H538 Other visual disturbances: Secondary | ICD-10-CM | POA: Diagnosis not present

## 2019-06-03 DIAGNOSIS — H9193 Unspecified hearing loss, bilateral: Secondary | ICD-10-CM

## 2019-06-03 DIAGNOSIS — A881 Epidemic vertigo: Secondary | ICD-10-CM | POA: Diagnosis not present

## 2019-06-03 DIAGNOSIS — R42 Dizziness and giddiness: Secondary | ICD-10-CM | POA: Diagnosis not present

## 2019-06-03 MED ORDER — GADOBENATE DIMEGLUMINE 529 MG/ML IV SOLN
20.0000 mL | Freq: Once | INTRAVENOUS | Status: AC | PRN
  Administered 2019-06-03: 20 mL via INTRAVENOUS

## 2019-06-06 ENCOUNTER — Ambulatory Visit
Admission: RE | Admit: 2019-06-06 | Discharge: 2019-06-06 | Disposition: A | Discharge status: Home / Self Care | Payer: Managed Care, Other (non HMO) | Source: Ambulatory Visit | Attending: Podiatry | Admitting: Podiatry

## 2019-06-06 ENCOUNTER — Other Ambulatory Visit: Payer: Self-pay

## 2019-06-06 DIAGNOSIS — M79671 Pain in right foot: Secondary | ICD-10-CM

## 2019-06-08 ENCOUNTER — Telehealth: Payer: Self-pay | Admitting: Neurology

## 2019-06-08 NOTE — Telephone Encounter (Signed)
Called the patient informed her of the MRI results being normal. Pt verbalized understanding. Pt had no questions at this time but was encouraged to call back if questions arise.

## 2019-06-08 NOTE — Telephone Encounter (Signed)
-----   Message from Melvenia Beam, MD sent at 06/07/2019  9:26 AM EST ----- Unremarkable MRI brain

## 2019-06-11 ENCOUNTER — Ambulatory Visit: Payer: BC Managed Care – PPO | Attending: Neurology | Admitting: Physical Therapy

## 2019-06-11 ENCOUNTER — Other Ambulatory Visit: Payer: Self-pay

## 2019-06-11 VITALS — BP 130/93 | HR 79

## 2019-06-11 DIAGNOSIS — R42 Dizziness and giddiness: Secondary | ICD-10-CM | POA: Diagnosis not present

## 2019-06-12 ENCOUNTER — Encounter: Payer: Self-pay | Admitting: Physical Therapy

## 2019-06-12 NOTE — Therapy (Signed)
[facility] [address] [place], [place], [address] Phone: [phone]   Fax:  [phone]  Physical Therapy Evaluation  Patient Details  Name: Terri Lee MRN: [id number] Date of Birth: 03/25/1966 Referring Provider (PT): Dr. Naomie Dean    Encounter Date: 06/11/2019  PT End of Session - 06/12/19 2021    Visit Number  1    Number of Visits  1    Authorization Type  BCBS    PT Start Time  1404    PT Stop Time  1451    PT Time Calculation (min)  47 min    Activity Tolerance  Patient tolerated treatment well    Behavior During Therapy  [facility] for tasks assessed/performed       Past Medical History:  Diagnosis Date  . Anxiety   . Depression   . No pertinent past medical history     Past Surgical History:  Procedure Laterality Date  . CESAREAN SECTION    . DIAGNOSTIC LAPAROSCOPY    . HYSTEROSCOPY W/D&C  05/08/2012   Procedure: DILATATION AND CURETTAGE /HYSTEROSCOPY;  Surgeon: Meriel Pica, MD;  Location: [facility];  Service: Gynecology;  Laterality: N/A;  with Truclear  . TUBAL LIGATION      Vitals:   06/11/19 1430 06/11/19 1431 06/11/19 1434  BP: (!) 145/95 (!) 149/102 (!) 130/93  Pulse: 71 82 79     Subjective Assessment - 06/12/19 2010    Subjective  Pt reports she has spontaneous episodes of dizziness or light-headedness; denies room spinning vertigo since the summer - pt states "I have these little episodes" ; reports it is usually a daily thing - varies in intensity; has had nausea in the past, but not recently; has noticed a change in her vision    Patient Stated Goals  resolve the vertigo    Currently in Pain?  No/denies         [facility] PT Assessment - 06/12/19 0001      Assessment   Medical Diagnosis  Vertigo    Referring Provider (PT)  Dr. Naomie Dean     Onset Date/Surgical Date  02/14/19      Balance Screen   Has the patient fallen in the past 6 months  No    Has the patient had a  decrease in activity level because of a fear of falling?   No    Is the patient reluctant to leave their home because of a fear of falling?   No      Prior Function   Level of Independence  Independent    Vocation  Full time employment           Vestibular Assessment - 06/12/19 0001      Symptom Behavior   Subjective history of current problem  pt states she went to ED on 02-14-19 with vertigo - was diagnosed with BPPV and treated with Epley's maneuver     Type of Dizziness   Vertigo;Comment   short duration episodes of dizziness    Frequency of Dizziness  intermittent     Duration of Dizziness  seconds    Symptom Nature  Spontaneous    Aggravating Factors  No known aggravating factors    Relieving Factors  Rest    Progression of Symptoms  Better      Oculomotor Exam   Oculomotor Alignment  Normal    Spontaneous  Absent    Gaze-induced   Absent    Smooth  Pursuits  Intact    Saccades  Intact      Oculomotor Exam-Fixation Suppressed    Ocular Alignment  WNL's      Visual Acuity   Static  line 8    Dynamic  line 7   WNL's     Positional Testing   Dix-Hallpike  Dix-Hallpike Right;Dix-Hallpike Left    Sidelying Test  Sidelying Right;Sidelying Left    Horizontal Canal Testing  Horizontal Canal Right;Horizontal Canal Left      Dix-Hallpike Right   Dix-Hallpike Right Duration  none    Dix-Hallpike Right Symptoms  No nystagmus      Dix-Hallpike Left   Dix-Hallpike Left Duration  none    Dix-Hallpike Left Symptoms  No nystagmus      Sidelying Right   Sidelying Right Duration  none    Sidelying Right Symptoms  No nystagmus      Sidelying Left   Sidelying Left Duration  none    Sidelying Left Symptoms  No nystagmus      Horizontal Canal Right   Horizontal Canal Right Duration  none    Horizontal Canal Right Symptoms  Normal      Horizontal Canal Left   Horizontal Canal Left Duration  none    Horizontal Canal Left Symptoms  Normal          Objective  measurements completed on examination: See above findings.              PT Education - 06/12/19 2019    Education Details  recommended pt to drink 6 glasses 8 oz water/daily and monitor BP closely as pt has HTN at time of today's initial PT evaluation    Person(s) Educated  Patient    Methods  Explanation    Comprehension  Verbalized understanding                  Plan - 06/12/19 2025    Clinical Impression Statement  Pt has no signs or symptoms of BPPV and no signs of vestibular hypofunction at this time as pt reports no dizziness with any positional testing and no nystagmus noted with any positional testing.  Pt able to stand with feet together with EO and also with EC o compliant surface with no major LOB and no c/o dizziness.  Pt's BP is elevated at today's PT initial eval - instructed pt to monitor BP and to follow up with PCP if BP continues to be elevated.    Personal Factors and Comorbidities  Comorbidity 1;Time since onset of injury/illness/exacerbation;Fitness    Examination-Activity Limitations  Locomotion Level;Transfers    Examination-Participation Restrictions  Driving;Interpersonal Relationship;Shop;Community Activity;Cleaning    Stability/Clinical Decision Making  Stable/Uncomplicated    Clinical Decision Making  Low    Rehab Potential  Good    PT Frequency  One time visit    PT Treatment/Interventions  ADLs/Self Care Home Management;Vestibular;Neuromuscular re-education;Patient/family education    PT Next Visit Plan  N/A - eval only as no vertigo provoked during eval    Consulted and Agree with Plan of Care  Patient       Patient will benefit from skilled therapeutic intervention in order to improve the following deficits and impairments:  Dizziness  Visit Diagnosis: Dizziness and giddiness - Plan: PT plan of care cert/re-cert     Problem List Patient Active Problem List   Diagnosis Date Noted  . Vertigo 05/25/2019  . Trochanteric bursitis  of right hip 05/23/2018  . Acute pain of right  knee 09/04/2016  . Carpal tunnel syndrome of right wrist 03/19/2016  . Primary osteoarthritis of first carpometacarpal joint of right hand 01/24/2016  . Depression 10/12/2015  . GERD (gastroesophageal reflux disease) 10/12/2015    Rodman Recupero, [place], PT 06/12/2019, 8:34 PM  [facility] [address] [place], [place], [address] Phone: [phone]   Fax:  [phone]  Name: Denni France MRN: [id number] Date of Birth: [birth year]

## 2019-06-15 ENCOUNTER — Telehealth: Payer: Self-pay | Admitting: Podiatry

## 2019-06-15 NOTE — Telephone Encounter (Signed)
I'm scheduled to see Dr. Milinda Pointer for my MRI results and consult for surgery on 12/08. I'd like to go ahead and get my surgery done before the end of the year as I have already met my deductible. I told the pt I could get her tentatively scheduled for surgery on 12/18 but that if someone comes in before her and needs to get scheduled, she may have to be scheduled in the new year. I told the pt when she comes in on 12/08 they would confirm the date of her surgery, dispense her boot, and give her the information regarding the surgical center so she could pr-register. I told her we cannot give times for surgery as it depends on the surgical center having possible changes to their schedules or any pt's that are diabetic or pediatric that would need to go back first. I told her someone always calls a day or two prior to surgery to let you know what time to arrive.

## 2019-07-07 ENCOUNTER — Ambulatory Visit: Payer: BC Managed Care – PPO | Admitting: Podiatry

## 2019-07-14 ENCOUNTER — Other Ambulatory Visit: Payer: Self-pay

## 2019-07-14 ENCOUNTER — Encounter

## 2019-07-14 ENCOUNTER — Encounter: Payer: Self-pay | Admitting: Podiatry

## 2019-07-14 ENCOUNTER — Ambulatory Visit (INDEPENDENT_AMBULATORY_CARE_PROVIDER_SITE_OTHER): Payer: BC Managed Care – PPO | Admitting: Podiatry

## 2019-07-14 ENCOUNTER — Telehealth: Payer: Self-pay | Admitting: Podiatry

## 2019-07-14 DIAGNOSIS — M722 Plantar fascial fibromatosis: Secondary | ICD-10-CM

## 2019-07-14 NOTE — Telephone Encounter (Signed)
DOS: 07/17/2019  SURGICAL PROCEDURE: Endoscopic Plantar Fasciotomy [id number])  BCBS Policy Effective : 14/43/1540  -  07/29/9998   Member Liability Summary       In-Network   Max Per Benefit Period Year-to-Date Remaining     CoInsurance         Deductible $800.00             $0.00     Out-Of-Pocket 3 $4,200.00 $2,547.50 3 Out-of-Pocket includes copay, deductible, and coinsurance.  [facility]      In-Network Copay Coinsurance Authorization Required Not Applicable 08%  See Messages Utilization Management Organization: [place] Telephone:  [phone] Utilization Management Organization: [facility] [place] [employer] Telephone:  [phone]  Per Linton Rump L no prior authorization or referral is required. Call ref# [id number].

## 2019-07-14 NOTE — Patient Instructions (Signed)
Pre-Operative Instructions  Congratulations, you have decided to take an important step towards improving your quality of life.  You can be assured that the doctors and staff at [facility] will be with you every step of the way.  Here are some important things you should know:  1. Plan to be at the surgery center/hospital at least 1 (one) hour prior to your scheduled time, unless otherwise directed by the surgical center/hospital staff.  You must have a responsible adult accompany you, remain during the surgery and drive you home.  Make sure you have directions to the surgical center/hospital to ensure you arrive on time. 2. If you are having surgery at [facility] or [facility] hospitals, you will need a copy of your medical history and physical form from your family physician within one month prior to the date of surgery. We will give you a form for your primary physician to complete.  3. We make every effort to accommodate the date you request for surgery.  However, there are times where surgery dates or times have to be moved.  We will contact you as soon as possible if a change in schedule is required.   4. No aspirin/ibuprofen for one week before surgery.  If you are on aspirin, any non-steroidal anti-inflammatory medications (Mobic, Aleve, Ibuprofen) should not be taken seven (7) days prior to your surgery.  You make take Tylenol for pain prior to surgery.  5. Medications - If you are taking daily heart and blood pressure medications, seizure, reflux, allergy, asthma, anxiety, pain or diabetes medications, make sure you notify the surgery center/hospital before the day of surgery so they can tell you which medications you should take or avoid the day of surgery. 6. No food or drink after midnight the night before surgery unless directed otherwise by surgical center/hospital staff. 7. No alcoholic beverages 24-hours prior to surgery.  No smoking 24-hours prior or 24-hours after  surgery. 8. Wear loose pants or shorts. They should be loose enough to fit over bandages, boots, and casts. 9. Don't wear slip-on shoes. Sneakers are preferred. 10. Bring your boot with you to the surgery center/hospital.  Also bring crutches or a walker if your physician has prescribed it for you.  If you do not have this equipment, it will be provided for you after surgery. 11. If you have not been contacted by the surgery center/hospital by the day before your surgery, call to confirm the date and time of your surgery. 12. Leave-time from work may vary depending on the type of surgery you have.  Appropriate arrangements should be made prior to surgery with your employer. 13. Prescriptions will be provided immediately following surgery by your doctor.  Fill these as soon as possible after surgery and take the medication as directed. Pain medications will not be refilled on weekends and must be approved by the doctor. 14. Remove nail polish on the operative foot and avoid getting pedicures prior to surgery. 15. Wash the night before surgery.  The night before surgery wash the foot and leg well with water and the antibacterial soap provided. Be sure to pay special attention to beneath the toenails and in between the toes.  Wash for at least three (3) minutes. Rinse thoroughly with water and dry well with a towel.  Perform this wash unless told not to do so by your physician.  Enclosed: 1 Ice pack (please put in freezer the night before surgery)   1 Hibiclens skin cleaner     Pre-op instructions  If you have any questions regarding the instructions, please do not hesitate to call our office.  [place]: [address], [place], [place] [address] -- [phone]  [place]: [address] [place], [place] [address] -- [phone]  [place]: [address], [place], [place] [address] -- [phone]   Website: [email or web address] 

## 2019-07-15 NOTE — Progress Notes (Signed)
She presents today for follow-up of her MRI right foot.  She states that the foot is so bad at this point cut it off.  Limits her ability to perform any type of activities.  ROS: Denies fever chills nausea vomiting muscle aches pains calf pain back pain chest pain shortness of breath.  Meds unchanged.  No allergies.   Objective: Denies changes in her past medical history medications allergies surgery social history review of systems.  Pulses are strongly palpable.  She has some mild edema about the right lower extremity she has severe pain to palpation medial calcaneal tubercle of the right heel.  MRI does demonstrate medial band plantar fasciitis.  Plan: Chronic intractable plantar fasciitis right.  Plan: Discussed etiology pathology conservative versus surgical therapies at this point we will plan on medial band release endoscopic plantar fasciotomy however I did discuss the possible need for total fasciotomy.  She understands and is amenable to it we provided her with instructions for the morning of preop provided with her with information regarding the surgery center and anesthesia group.  We also discussed the possible postop complications which may include but not limited to postop pain bleeding swelling infection recurrence need for further surgery overcorrection under correction loss of digit loss of limb loss of life and chronic pain.  Dispensed a Cam walker today I will follow-up with her Friday of this week for surgery.

## 2019-07-16 ENCOUNTER — Other Ambulatory Visit: Payer: Self-pay | Admitting: Podiatry

## 2019-07-16 MED ORDER — CEPHALEXIN 500 MG PO CAPS: 500.0000 mg | ORAL_CAPSULE | Freq: Three times a day (TID) | ORAL | 0 refills | Status: DC

## 2019-07-16 MED ORDER — ONDANSETRON HCL 4 MG PO TABS: 4.0000 mg | ORAL_TABLET | Freq: Three times a day (TID) | ORAL | 0 refills | Status: AC | PRN

## 2019-07-16 MED ORDER — OXYCODONE-ACETAMINOPHEN 10-325 MG PO TABS: 1.0000 | ORAL_TABLET | Freq: Three times a day (TID) | ORAL | 0 refills | Status: AC | PRN

## 2019-07-17 ENCOUNTER — Encounter: Payer: Self-pay | Admitting: Podiatry

## 2019-07-17 DIAGNOSIS — M722 Plantar fascial fibromatosis: Secondary | ICD-10-CM | POA: Diagnosis not present

## 2019-07-21 ENCOUNTER — Other Ambulatory Visit: Payer: Self-pay

## 2019-07-21 ENCOUNTER — Ambulatory Visit (INDEPENDENT_AMBULATORY_CARE_PROVIDER_SITE_OTHER): Payer: BC Managed Care – PPO | Admitting: Podiatry

## 2019-07-21 VITALS — Temp 98.4°F

## 2019-07-21 DIAGNOSIS — M722 Plantar fascial fibromatosis: Secondary | ICD-10-CM

## 2019-07-21 DIAGNOSIS — Z9889 Other specified postprocedural states: Secondary | ICD-10-CM

## 2019-07-21 NOTE — Progress Notes (Signed)
She presents today for first postop visit date of surgery is 07/17/2019 status post endoscopic plantar fasciotomy right foot.  States that she has no concerns and she is doing very well.  She denies any calf pain chest pain shortness of breath.  Objective: Patient presents ambulating with a cam walker to the right lower extremity today with minimal antalgia.  Dressel dressing intact once removed demonstrates no erythema edema cellulitis drainage or odor.  Sutures are intact medially and laterally she has mild tenderness on palpation of the plantar fascia at its insertion site.  Assessment: Endoscopic plantar fasciotomy right x1 week.  Date of surgery 07/17/2019.  Plan: Redressed today with a compression anklet and a light dressing she will continue use of the cam walker 24/7 and I will follow-up with her in a week or so.

## 2019-07-30 ENCOUNTER — Encounter: Payer: Self-pay | Admitting: Podiatry

## 2019-07-30 ENCOUNTER — Other Ambulatory Visit: Payer: Self-pay

## 2019-07-30 ENCOUNTER — Ambulatory Visit (INDEPENDENT_AMBULATORY_CARE_PROVIDER_SITE_OTHER): Payer: BC Managed Care – PPO | Admitting: Podiatry

## 2019-07-30 DIAGNOSIS — M722 Plantar fascial fibromatosis: Secondary | ICD-10-CM

## 2019-07-30 DIAGNOSIS — Z9889 Other specified postprocedural states: Secondary | ICD-10-CM

## 2019-07-30 NOTE — Progress Notes (Signed)
Terri Lee presents today for follow-up of her endoscopic plantar fasciotomy of her right foot.  She states that is a little sore the mid medial longitudinal arch about the level of the midfoot she denies fever chills nausea vomiting muscle aches and pains continues to wear her cam walker regularly.  Objective: Vital signs stable oriented x3 cam walker was removed Band-Aids to the incision sites were removed sutures appear to be intact margins well coapted with ahead remove the sutures today.  Margins remain well coapted she has some tenderness on palpation of the surgical site plantar aspect of the foot none in the medial longitudinal arch.  Assessment: Well-healing endoscopic plantar fasciotomy x2 weeks.  Plan: Sutures were removed today continue to wear the compression anklet during the day tennis shoe is much as possible during the day otherwise cam walker.  She will also utilize a night splint or the cam walker at bedtime for 1 month.  She understands that and is amenable to it.  I will follow-up with her in 2 weeks at which time we will consider whether or not to return to work.

## 2019-08-13 ENCOUNTER — Encounter: Payer: Self-pay | Admitting: Podiatry

## 2019-08-13 ENCOUNTER — Other Ambulatory Visit: Payer: Self-pay

## 2019-08-13 ENCOUNTER — Ambulatory Visit (INDEPENDENT_AMBULATORY_CARE_PROVIDER_SITE_OTHER): Payer: Self-pay | Admitting: Podiatry

## 2019-08-13 DIAGNOSIS — Z9889 Other specified postprocedural states: Secondary | ICD-10-CM

## 2019-08-13 DIAGNOSIS — M722 Plantar fascial fibromatosis: Secondary | ICD-10-CM

## 2019-08-16 NOTE — Progress Notes (Signed)
She presents today for follow-up of her endoscopic plantar fasciotomy date of surgery July 17, 2019 she states that "it is getting there." It is much better than it was.  Objective: Vital signs are stable alert and oriented x3. Pulses are palpable. She has no pain on palpation medial calcaneal tubercle of the right foot.  Assessment: Plantar fasciitis endoscopic plantar fasciotomy well-healing.  Plan: Follow-up with me in 1 month. Write a note for her to return back to work Monday, January 25 I will follow-up with her a couple weeks after she has been at work.

## 2019-08-18 ENCOUNTER — Encounter: Payer: BC Managed Care – PPO | Admitting: Podiatry

## 2019-08-18 ENCOUNTER — Telehealth: Payer: Self-pay | Admitting: *Deleted

## 2019-08-18 NOTE — Telephone Encounter (Signed)
Pt states she had surgery 07/17/2019 and was walking without tripping and felt a pop in her foot.

## 2019-08-18 NOTE — Telephone Encounter (Signed)
I spoke with pt and instructed her to go back into the surgery shoe or boot, rest, ice and elevate. Pt states she is back in the boot and has some comfort. I told her resting the area and keeping it from continuing to irritate or injure the area will help with the pain, but it should be evaluated to make sure there was not a problem. Transferred to scheduler.

## 2019-08-20 ENCOUNTER — Other Ambulatory Visit: Payer: Self-pay | Admitting: Podiatry

## 2019-08-20 ENCOUNTER — Encounter: Payer: Self-pay | Admitting: Podiatry

## 2019-08-20 ENCOUNTER — Ambulatory Visit (INDEPENDENT_AMBULATORY_CARE_PROVIDER_SITE_OTHER): Payer: BC Managed Care – PPO

## 2019-08-20 ENCOUNTER — Other Ambulatory Visit: Payer: Self-pay

## 2019-08-20 ENCOUNTER — Ambulatory Visit (INDEPENDENT_AMBULATORY_CARE_PROVIDER_SITE_OTHER): Payer: BC Managed Care – PPO | Admitting: Podiatry

## 2019-08-20 ENCOUNTER — Encounter: Payer: BC Managed Care – PPO | Admitting: Podiatry

## 2019-08-20 DIAGNOSIS — Z9889 Other specified postprocedural states: Secondary | ICD-10-CM

## 2019-08-20 DIAGNOSIS — S93691A Other sprain of right foot, initial encounter: Secondary | ICD-10-CM

## 2019-08-20 DIAGNOSIS — M722 Plantar fascial fibromatosis: Secondary | ICD-10-CM

## 2019-08-20 MED ORDER — OXYCODONE-ACETAMINOPHEN 10-325 MG PO TABS: 1.0000 | ORAL_TABLET | Freq: Three times a day (TID) | ORAL | 0 refills | Status: AC | PRN

## 2019-08-20 NOTE — Progress Notes (Signed)
She presents today date of surgery 07/17/2019 status post EPF right states that she was doing really go walk around in her tennis shoes and felt a pop and suddenly had severe pain in her right heel that was Monday and she says it it did not get any better by Tuesday so she called.  She was back in her cam boot.  She denies any trauma to the foot itself.  Objective: Vital signs are stable she is alert and oriented x3 pulses are palpable.  Neurologic sensorium is intact.  DP reflexes are intact.  Muscle strength normal symmetrical.  There is no swelling to the right foot.  She has tenderness on palpation of the central and lateral bands of the plantar fascia which were released.  More than likely there was a few fibers remaining that that released by themselves.  Assessment: Plantar fascial tear finishing off an endoscopic plantar fasciotomy.  Plan: I went ahead and suggested that she stay in her boot for the next week I would like her to continue out of work at least another 2 weeks until I see her again.

## 2019-09-01 ENCOUNTER — Encounter: Payer: Self-pay | Admitting: Podiatry

## 2019-09-01 ENCOUNTER — Ambulatory Visit (INDEPENDENT_AMBULATORY_CARE_PROVIDER_SITE_OTHER): Payer: BC Managed Care – PPO | Admitting: Podiatry

## 2019-09-01 ENCOUNTER — Other Ambulatory Visit: Payer: Self-pay

## 2019-09-01 ENCOUNTER — Encounter: Payer: BC Managed Care – PPO | Admitting: Podiatry

## 2019-09-01 DIAGNOSIS — M722 Plantar fascial fibromatosis: Secondary | ICD-10-CM

## 2019-09-01 DIAGNOSIS — Z9889 Other specified postprocedural states: Secondary | ICD-10-CM

## 2019-09-01 DIAGNOSIS — S93691D Other sprain of right foot, subsequent encounter: Secondary | ICD-10-CM

## 2019-09-01 NOTE — Progress Notes (Addendum)
She presents today for follow-up of her plantar fasciotomy.  States that is doing really well date of surgery was 07/17/2019 right foot.  She is about 6 weeks out states that has not been bothering her regular regular shoes.  Objective: Vital signs are stable she is alert and oriented x3 there is no erythema edema cellulitis drainage or odor the skin is really supple there is no signs of inflammation and there is no scar tissue present.  Assessment: Well-healing surgical foot right status post EPF 07/17/2019.  Plan: Allow her to get back to work I will follow-up with her on an as-needed basis explained to her that she can take anti-inflammatories ibuprofen Motrin Aleve also utilize cam walker and night splint.

## 2019-10-08 ENCOUNTER — Other Ambulatory Visit: Payer: Self-pay

## 2019-10-08 ENCOUNTER — Ambulatory Visit (INDEPENDENT_AMBULATORY_CARE_PROVIDER_SITE_OTHER): Payer: BC Managed Care – PPO | Admitting: Podiatry

## 2019-10-08 DIAGNOSIS — Z9889 Other specified postprocedural states: Secondary | ICD-10-CM

## 2019-10-08 DIAGNOSIS — L02611 Cutaneous abscess of right foot: Secondary | ICD-10-CM

## 2019-10-08 DIAGNOSIS — M722 Plantar fascial fibromatosis: Secondary | ICD-10-CM

## 2019-10-08 DIAGNOSIS — S93691D Other sprain of right foot, subsequent encounter: Secondary | ICD-10-CM

## 2019-10-08 NOTE — Progress Notes (Signed)
She presents today for follow-up of her EPF.  Date of surgery is 07/17/2019 she states that he continues to have some dull pain but all in all is doing pretty well.  I have this 1 area right here that came up at the end of the incision as she points to a very small round area at the plantar aspect of the medial incision.  Denies fever chills nausea vomiting muscle aches and pains.  Objective: Vital signs are stable alert and oriented x3 there is no erythema edema cellulitis drainage or odor minimal pain on palpation of the plantar medial longitudinal arch.  She does have a small area at the plantar distalmost aspect of the medial incision is very small measures about 3 mm in diameter and looks like a dried over area of purulence.  Almost like an abscess.  I was able to pluck this out after alcohol skin prep did not see anything within it.  I did not see anything deep and there I felt around with the tip of the blade and did not feel any foreign body any remaining suture or anything like that.  Assessment: Well-healing plantar fasciotomy right foot.  Inclusion body right medial port.  Plan: Discussed etiology pathology and surgical therapies at this point time I think we need to go ahead and start soaking this. Also warm water should it recur she will notify me and we will numb it up and I the punch it out or open it to make sure there is nothing there.

## 2019-10-20 ENCOUNTER — Encounter: Payer: BC Managed Care – PPO | Admitting: Podiatry

## 2020-02-16 ENCOUNTER — Emergency Department (HOSPITAL_COMMUNITY): Payer: BC Managed Care – PPO

## 2020-02-16 ENCOUNTER — Other Ambulatory Visit: Payer: Self-pay

## 2020-02-16 ENCOUNTER — Emergency Department (HOSPITAL_COMMUNITY)
Admission: EM | Admit: 2020-02-16 | Discharge: 2020-02-16 | Disposition: A | Discharge status: Home / Self Care | Payer: BC Managed Care – PPO | Attending: Emergency Medicine | Admitting: Emergency Medicine

## 2020-02-16 DIAGNOSIS — F17210 Nicotine dependence, cigarettes, uncomplicated: Secondary | ICD-10-CM | POA: Diagnosis not present

## 2020-02-16 DIAGNOSIS — Z7982 Long term (current) use of aspirin: Secondary | ICD-10-CM | POA: Diagnosis not present

## 2020-02-16 DIAGNOSIS — E860 Dehydration: Secondary | ICD-10-CM | POA: Diagnosis not present

## 2020-02-16 DIAGNOSIS — U071 COVID-19: Secondary | ICD-10-CM

## 2020-02-16 DIAGNOSIS — R112 Nausea with vomiting, unspecified: Secondary | ICD-10-CM

## 2020-02-16 DIAGNOSIS — R1110 Vomiting, unspecified: Secondary | ICD-10-CM | POA: Diagnosis present

## 2020-02-16 LAB — CBC
HCT: 46.5 % — ABNORMAL HIGH (ref 36.0–46.0)
Hemoglobin: 15.8 g/dL — ABNORMAL HIGH (ref 12.0–15.0)
MCH: 29.4 pg (ref 26.0–34.0)
MCHC: 34 g/dL (ref 30.0–36.0)
MCV: 86.6 fL (ref 80.0–100.0)
Platelets: 165 10*3/uL (ref 150–400)
RBC: 5.37 MIL/uL — ABNORMAL HIGH (ref 3.87–5.11)
RDW: 12.8 % (ref 11.5–15.5)
WBC: 5.3 10*3/uL (ref 4.0–10.5)
nRBC: 0 % (ref 0.0–0.2)

## 2020-02-16 LAB — BASIC METABOLIC PANEL
Anion gap: 11 (ref 5–15)
BUN: 8 mg/dL (ref 6–20)
CO2: 24 mmol/L (ref 22–32)
Calcium: 9.6 mg/dL (ref 8.9–10.3)
Chloride: 100 mmol/L (ref 98–111)
Creatinine, Ser: 1.09 mg/dL — ABNORMAL HIGH (ref 0.44–1.00)
GFR calc Af Amer: >60 mL/min (ref >60)
GFR calc non Af Amer: 57 mL/min — ABNORMAL LOW (ref >60)
Glucose, Bld: 101 mg/dL — ABNORMAL HIGH (ref 70–99)
Potassium: 3.8 mmol/L (ref 3.5–5.1)
Sodium: 135 mmol/L (ref 135–145)

## 2020-02-16 MED ORDER — LORAZEPAM 2 MG/ML IJ SOLN
0.5000 mg | Freq: Once | INTRAMUSCULAR | Status: AC
  Administered 2020-02-16: 0.5 mg via INTRAVENOUS
  Filled 2020-02-16: qty 1

## 2020-02-16 MED ORDER — PROMETHAZINE HCL 25 MG PO TABS
25.0000 mg | ORAL_TABLET | Freq: Four times a day (QID) | ORAL | 0 refills | Status: AC | PRN
Start: 2020-02-16 — End: ?

## 2020-02-16 MED ORDER — FENTANYL CITRATE (PF) 100 MCG/2ML IJ SOLN
50.0000 ug | Freq: Once | INTRAMUSCULAR | Status: AC
  Administered 2020-02-16: 50 ug via INTRAVENOUS
  Filled 2020-02-16: qty 2

## 2020-02-16 MED ORDER — FAMOTIDINE IN NACL 20-0.9 MG/50ML-% IV SOLN
20.0000 mg | Freq: Once | INTRAVENOUS | Status: AC
  Administered 2020-02-16: 20 mg via INTRAVENOUS
  Filled 2020-02-16: qty 50

## 2020-02-16 MED ORDER — ONDANSETRON HCL 4 MG/2ML IJ SOLN
4.0000 mg | Freq: Once | INTRAMUSCULAR | Status: AC
  Administered 2020-02-16: 4 mg via INTRAVENOUS
  Filled 2020-02-16: qty 2

## 2020-02-16 MED ORDER — SODIUM CHLORIDE 0.9 % IV BOLUS
1000.0000 mL | Freq: Once | INTRAVENOUS | Status: AC
  Administered 2020-02-16: 1000 mL via INTRAVENOUS

## 2020-02-16 NOTE — Discharge Instructions (Signed)
You were seen in the emergency department for evaluation of dehydration nausea and vomiting in the setting of a recent Covid diagnosis. Your lab work did not show any significant abnormalities and your chest x-ray did not show any obvious pneumonia. Your symptoms improved with some fluids and medication. We are prescribing you nausea medication to use. Please keep well-hydrated. Follow-up with your doctor. Return to the emergency department for any worsening or concerning symptoms.

## 2020-02-16 NOTE — ED Triage Notes (Signed)
Patient reports being diagnosed with COVID approx 1 week ago. Reports worsening symptoms since Friday which includes N/V and unable to tolerate food/fluids.

## 2020-02-16 NOTE — ED Provider Notes (Signed)
[facility] EMERGENCY DEPARTMENT Provider Note   CSN: [id number] Arrival date & time: 02/16/20  1753     History Chief Complaint  Patient presents with  . COVID    Terri Lee is a 54 y.o. female.  She was diagnosed with Covid about a week ago.  Continues to have cough, nausea, vomiting, body aches, low-grade fevers.  PCP gave her some Zofran which has not helped with the vomiting.  Hungry but has not really eaten in about a week.  The history is provided by the patient.  Emesis Severity:  Moderate Duration:  7 days Timing:  Constant Quality:  Stomach contents Progression:  Unchanged Chronicity:  New Recent urination:  Decreased Context: post-tussive   Relieved by:  Nothing Worsened by:  Nothing Ineffective treatments:  Antiemetics Associated symptoms: cough, fever and myalgias   Associated symptoms: no abdominal pain, no chills, no diarrhea and no sore throat   Risk factors: no suspect food intake        Past Medical History:  Diagnosis Date  . Anxiety   . Depression   . No pertinent past medical history     Patient Active Problem List   Diagnosis Date Noted  . Vertigo 05/25/2019  . Closed fracture of metatarsal bone 05/23/2018  . Acute pain of right knee 09/04/2016  . Carpal tunnel syndrome of right wrist 03/19/2016  . Primary osteoarthritis of first carpometacarpal joint of right hand 01/24/2016  . Depression 10/12/2015  . GERD (gastroesophageal reflux disease) 10/12/2015    Past Surgical History:  Procedure Laterality Date  . CESAREAN SECTION    . DIAGNOSTIC LAPAROSCOPY    . HYSTEROSCOPY WITH D & C  05/08/2012   Procedure: DILATATION AND CURETTAGE /HYSTEROSCOPY;  Surgeon: Meriel Pica, MD;  Location: [facility];  Service: Gynecology;  Laterality: N/A;  with Truclear  . TUBAL LIGATION       OB History   No obstetric history on file.     Family History  Problem Relation Age of Onset  . Diabetes Father     Social History    Tobacco Use  . Smoking status: Current Every Day Smoker    Packs/day: 0.50    Types: Cigarettes  . Smokeless tobacco: Never Used  Vaping Use  . Vaping Use: Never used  Substance Use Topics  . Alcohol use: Yes    Comment: rarely  . Drug use: No    Home Medications Prior to Admission medications   Medication Sig Start Date End Date Taking? Authorizing Provider  aspirin-acetaminophen-caffeine (EXCEDRIN MIGRAINE) [phone] MG per tablet Take 1 tablet by mouth daily as needed. For migraine not relieved by Ibuprofen    [provider]  FLUoxetine (PROZAC) 10 MG capsule Take by mouth daily. 11/24/18   [provider]  ibuprofen (ADVIL,MOTRIN) 200 MG tablet Take 400 mg by mouth 2 (two) times daily as needed. For headache    [provider]  LORazepam (ATIVAN) 0.5 MG tablet Take 0.5 mg by mouth daily as needed.  10/16/14   [provider]  meloxicam (MOBIC) 15 MG tablet Take 1 tablet (15 mg total) by mouth daily. 10/02/18   Hyatt, Max T, DPM  ondansetron (ZOFRAN) 4 MG tablet Take 1 tablet (4 mg total) by mouth every 8 (eight) hours as needed. 07/16/19   Hyatt, Max T, DPM  pantoprazole (PROTONIX) 40 MG tablet Take 40 mg by mouth daily. 05/04/19   [provider]    Allergies    Patient  has no known allergies.  Review of Systems   Review of Systems  Constitutional: Positive for fatigue and fever. Negative for chills.  HENT: Negative for sore throat.   Eyes: Negative for visual disturbance.  Respiratory: Positive for cough.   Cardiovascular: Negative for chest pain.  Gastrointestinal: Positive for nausea and vomiting. Negative for abdominal pain and diarrhea.  Genitourinary: Negative for dysuria.  Musculoskeletal: Positive for myalgias.  Skin: Negative for rash.  Neurological: Negative for speech difficulty.    Physical Exam Updated Vital Signs BP 133/73 (BP Location: Left Arm)   Pulse 90   Temp 99.5 F (37.5 C) (Oral)   Resp (!) 26    Ht 5\' 10"  (1.778 m)   Wt 101.2 kg   SpO2 99%   BMI 32.01 kg/m   Physical Exam Vitals and nursing note reviewed.  Constitutional:      General: She is not in acute distress.    Appearance: Normal appearance. She is well-developed.  HENT:     Head: Normocephalic and atraumatic.  Eyes:     Conjunctiva/sclera: Conjunctivae normal.  Cardiovascular:     Rate and Rhythm: Normal rate and regular rhythm.     Heart sounds: No murmur heard.   Pulmonary:     Effort: Pulmonary effort is normal. No respiratory distress.     Breath sounds: Normal breath sounds.  Abdominal:     Palpations: Abdomen is soft.     Tenderness: There is no abdominal tenderness.  Musculoskeletal:        General: Normal range of motion.     Cervical back: Neck supple.     Right lower leg: No edema.     Left lower leg: No edema.  Skin:    General: Skin is warm and dry.     Capillary Refill: Capillary refill takes less than 2 seconds.  Neurological:     General: No focal deficit present.     Mental Status: She is alert.     ED Results / Procedures / Treatments   Labs (all labs ordered are listed, but only abnormal results are displayed) Labs Reviewed  CBC - Abnormal; Notable for the following components:      Result Value   RBC 5.37 (*)    Hemoglobin 15.8 (*)    HCT 46.5 (*)    All other components within normal limits  BASIC METABOLIC PANEL - Abnormal; Notable for the following components:   Glucose, Bld 101 (*)    Creatinine, Ser 1.09 (*)    GFR calc non Af Amer 57 (*)    All other components within normal limits    EKG None  Radiology DG Chest Portable 1 View  Result Date: 02/16/2020 CLINICAL DATA:  COVID positive, chest pain EXAM: PORTABLE CHEST 1 VIEW COMPARISON:  None. FINDINGS: The heart size and mediastinal contours are within normal limits. Both lungs are clear. The visualized skeletal structures are unremarkable. IMPRESSION: No active disease. Electronically Signed   By: 02/18/2020  MD   On: 02/16/2020 19:21    Procedures Procedures (including critical care time)  Medications Ordered in ED Medications  sodium chloride 0.9 % bolus 1,000 mL (0 mLs Intravenous Stopped 02/16/20 2131)  ondansetron (ZOFRAN) injection 4 mg (4 mg Intravenous Given 02/16/20 2018)  fentaNYL (SUBLIMAZE) injection 50 mcg (50 mcg Intravenous Given 02/16/20 2131)  famotidine (PEPCID) IVPB 20 mg premix (0 mg Intravenous Stopped 02/16/20 2222)  LORazepam (ATIVAN) injection 0.5 mg (0.5 mg Intravenous Given 02/16/20 2131)  ED Course  I have reviewed the triage vital signs and the nursing notes.  Pertinent labs & imaging results that were available during my care of the patient were reviewed by me and considered in my medical decision making (see chart for details).  Clinical Course as of Feb 16 1029  Tue Feb 16, 2020  2223 Patient has drank some fluids and has eaten some crackers. No current nausea. She is comfortable with discharge and I will provide her a prescription for some Phenergan as needed Zofran did not seem to be helping. Recommend close follow-up with PCP and return instructions discussed   [MB]    Clinical Course User Index [MB] Terrilee Files, MD   MDM Rules/Calculators/A&P                         Itzamara Casas was evaluated in Emergency Department on 02/16/2020 for the symptoms described in the history of present illness. She was evaluated in the context of the global COVID-19 pandemic, which necessitated consideration that the patient might be at risk for infection with the SARS-CoV-2 virus that causes COVID-19. Institutional protocols and algorithms that pertain to the evaluation of patients at risk for COVID-19 are in a state of rapid change based on information released by regulatory bodies including the CDC and federal and state organizations. These policies and algorithms were followed during the patient's care in the ED.  This patient complains of nausea and vomiting, poor p.o.  intake, fatigue in the setting of Covid infection; this involves an extensive number of treatment Options and is a complaint that carries with it a high risk of complications and Morbidity. The differential includes Covid, dehydration, renal failure, metabolic derangement  I ordered, reviewed and interpreted labs, which included CBC with normal white count, hemoglobin elevated likely reflecting some dehydration, chemistries with elevated creatinine also reflecting some dehydration, electrolytes otherwise normal. I ordered medication IV fluids and nausea medication, medication for patient's anxiety, Pepcid for gastritis I ordered imaging studies which included chest x-ray and I independently    visualized and interpreted imaging which showed no gross infiltrates Previous records obtained and reviewed in epic, no recent information  After the interventions stated above, I reevaluated the patient and found patient's vital signs to be normal and satting well on room air.  She has tolerated p.o.  Currently no indications for admission.  Will discharge with symptomatic management and return instructions discussed.   Final Clinical Impression(s) / ED Diagnoses Final diagnoses:  Non-intractable vomiting with nausea, unspecified vomiting type  Dehydration  COVID-19 virus infection    Rx / DC Orders ED Discharge Orders         Ordered    promethazine (PHENERGAN) 25 MG tablet  Every 6 hours PRN     Discontinue  Reprint     02/16/20 2224           Terrilee Files, MD 02/17/20 [phone]

## 2020-12-01 ENCOUNTER — Ambulatory Visit: Payer: BC Managed Care – PPO | Admitting: Physician Assistant

## 2020-12-28 IMAGING — MR MR FOOT*R* W/O CM
5 series · 36 of 40 positions shown · non-contrast
Comparison: None.

CLINICAL DATA: Right foot pain for 10 years.  No known injury.

EXAM:
MRI OF THE RIGHT FOREFOOT WITHOUT CONTRAST
TECHNIQUE: Multiplanar, multisequence MR imaging of the right foot was
performed. No intravenous contrast was administered.

[Series 4: T2 fat-sat · axial · 3.0mm · 0.50mm/px · z∈[-35,+85]mm · 8 of 32 slices shown (1 of 2)]
[im 1/32]
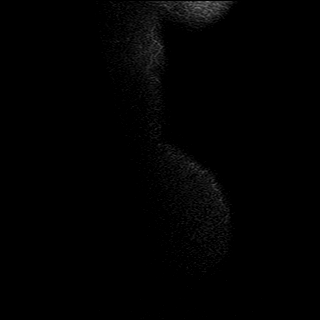
[im 4/32]
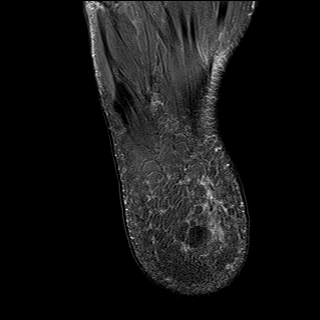
[im 11/32]
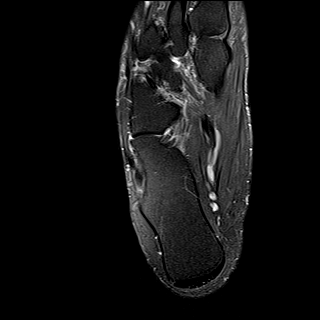
[im 14/32]
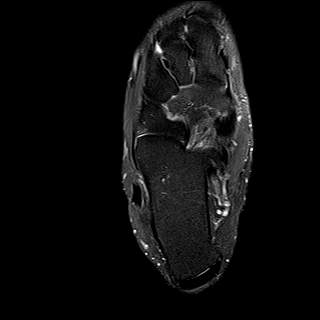
[im 18/32]
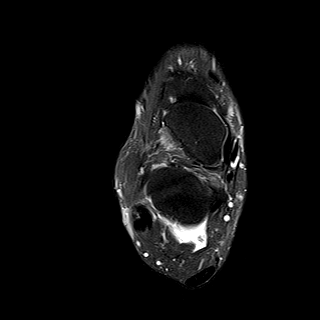
[im 21/32]
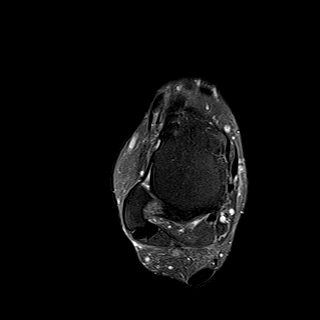
[im 28/32]
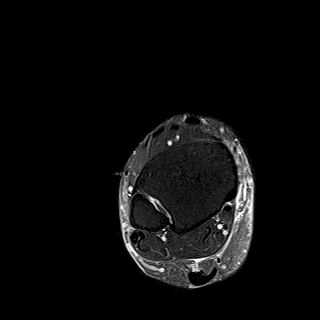
[im 32/32]
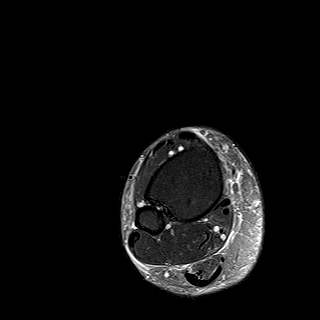

[Series 5: PD fat-sat · axial · 3.0mm · 0.42mm/px · z∈[-35,+85]mm · 10 of 32 slices shown]
[im 1/32]
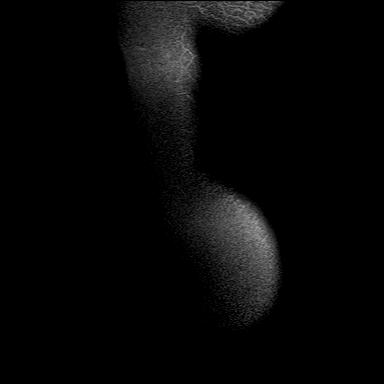
[im 4/32]
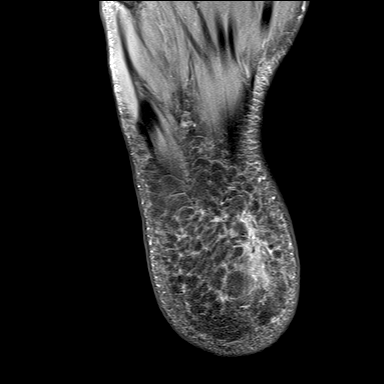
[im 7/32]
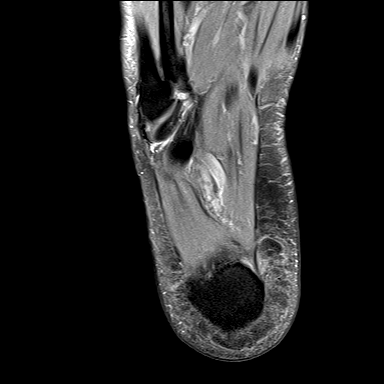
[im 11/32]
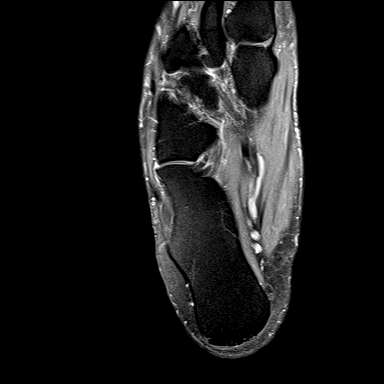
[im 14/32]
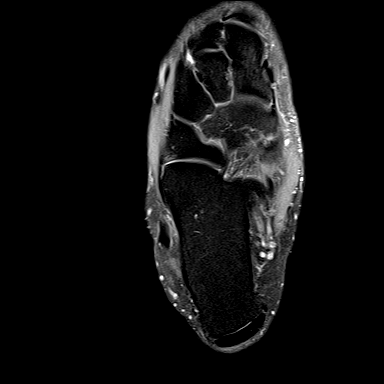
[im 18/32]
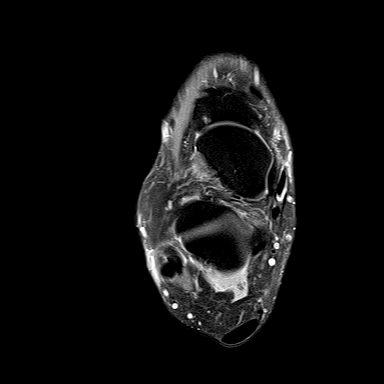
[im 21/32]
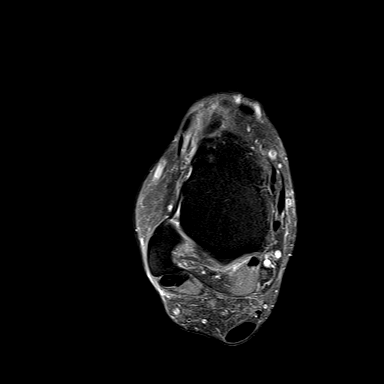
[im 25/32]
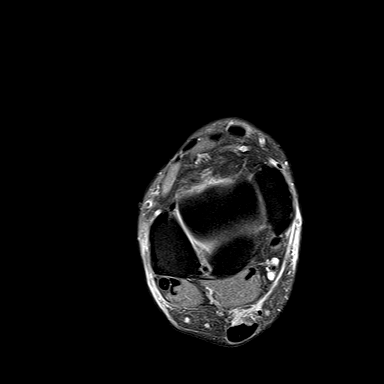
[im 28/32]
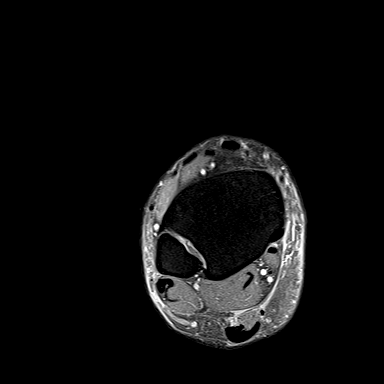
[im 32/32]
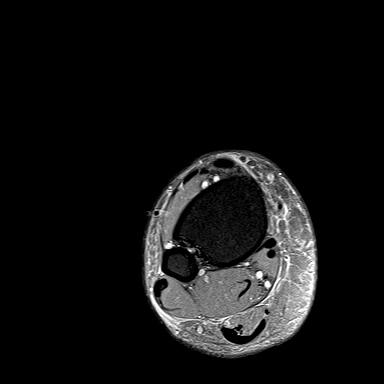

[Series 6: T1 · sagittal · 4.0mm · 0.56mm/px · 5 of 18 slices shown]
[im 1/18]
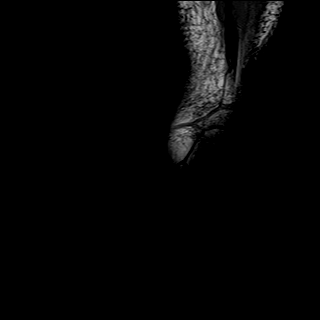
[im 5/18]
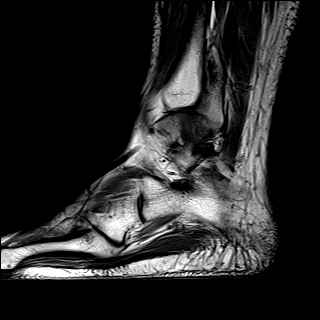
[im 9/18]
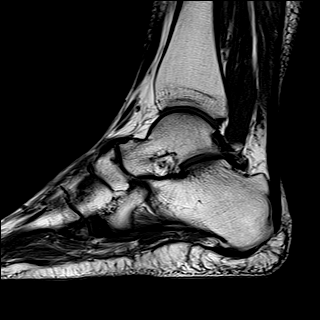
[im 13/18]
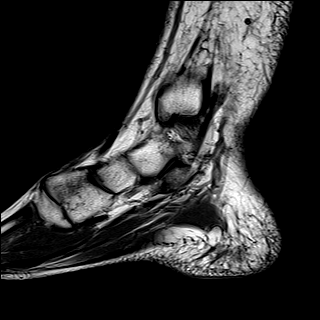
[im 18/18]
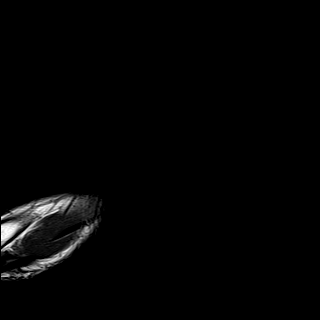

[Series 7: STIR · sagittal · 4.0mm · 0.35mm/px · 5 of 18 slices shown]
[im 1/18]
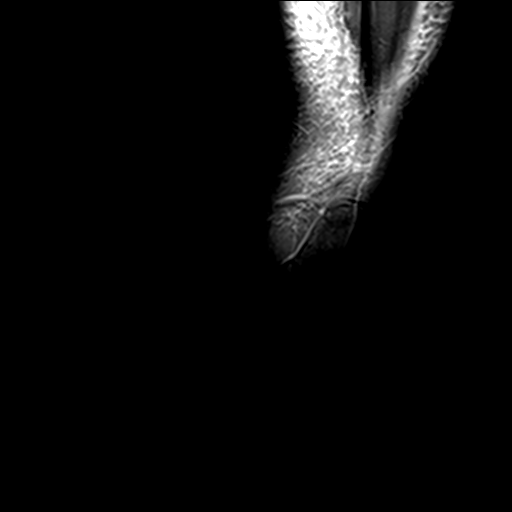
[im 5/18]
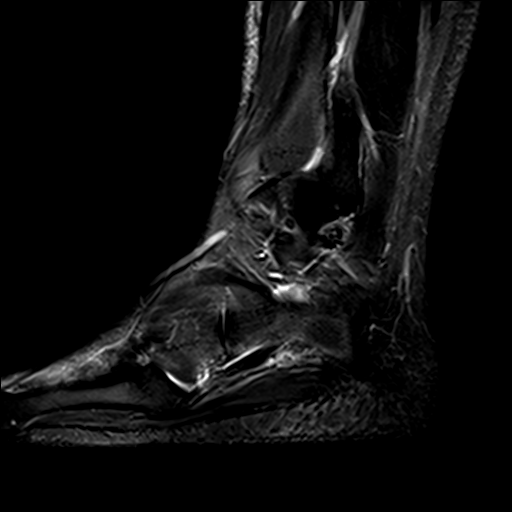
[im 9/18]
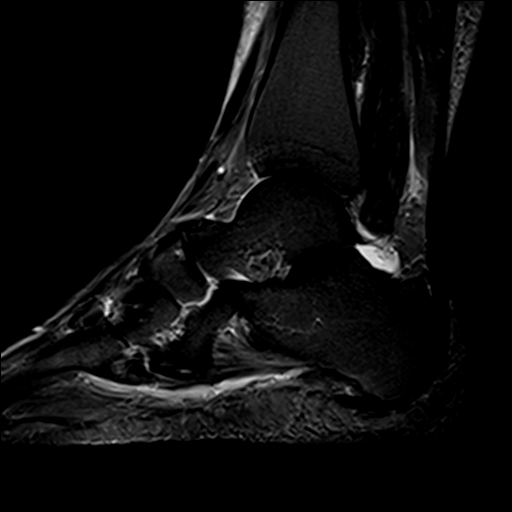
[im 13/18]
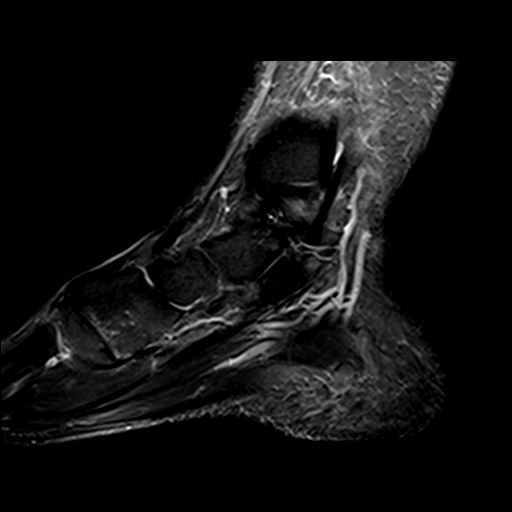
[im 18/18]
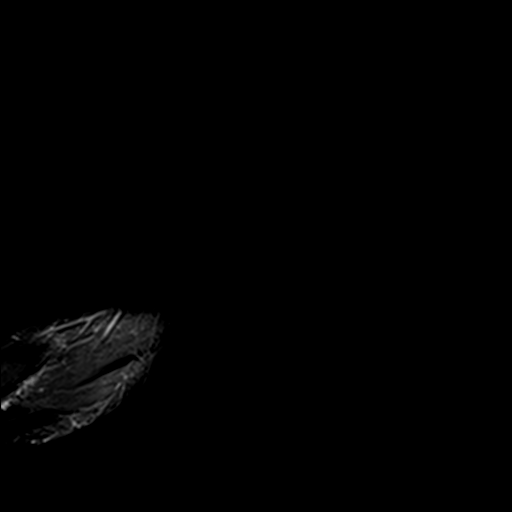

[Series 8: T2 fat-sat · coronal · 3.0mm · 0.50mm/px · 8 of 35 slices shown (2 of 2)]
[im 1/35]
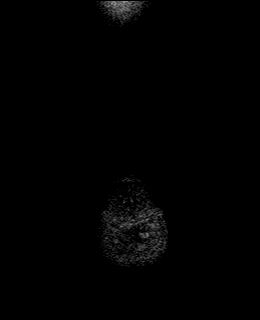
[im 4/35]
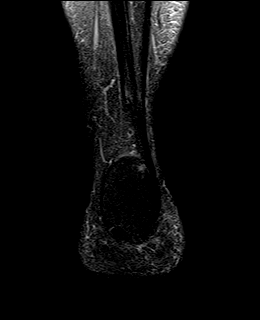
[im 12/35]
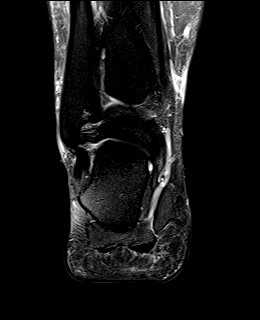
[im 16/35]
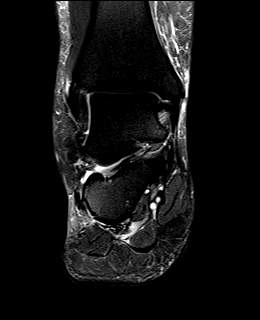
[im 19/35]
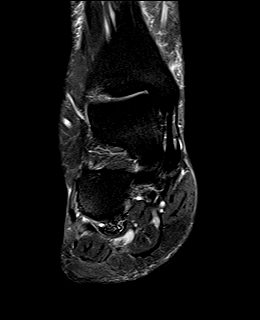
[im 23/35]
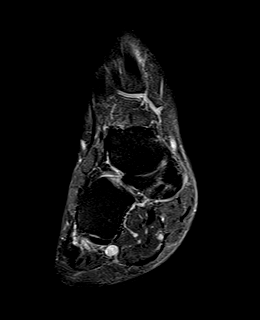
[im 31/35]
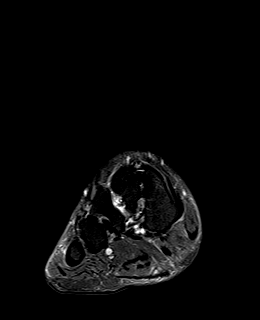
[im 35/35]
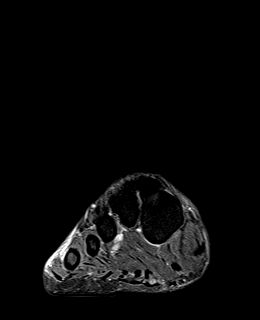

[36 of 40 positions shown; findings below may reference images not displayed]

FINDINGS: TENDONS

Peroneal: Peroneal longus tendon intact. Peroneal brevis intact.

Posteromedial: Posterior tibial tendon intact. Flexor hallucis
longus tendon intact. Flexor digitorum longus tendon intact.

Anterior: Tibialis anterior tendon intact. Extensor hallucis longus
tendon intact Extensor digitorum longus tendon intact.

Achilles:  Intact.

Plantar Fascia: Thickening and increased signal of the medial band
of the plantar fascia with mild subcortical reactive marrow edema
with a small partial-thickness tear.

LIGAMENTS

Lateral: Anterior talofibular ligament intact. Calcaneofibular
ligament intact. Posterior talofibular ligament intact. Anterior and
posterior tibiofibular ligaments intact.

Medial: Deltoid ligament intact. Spring ligament intact.

Lisfranc: Intact

CARTILAGE

Ankle Joint: No joint effusion. Normal ankle mortise. No chondral
defect.

Subtalar Joints/Sinus Tarsi: Normal subtalar joints. Small subtalar
joint effusion. Normal sinus tarsi.

Bones: Mild osteoarthritis of the talonavicular joint. Enthesopathic
changes of the Achilles tendon insertion. Mild osteoarthritis of the
second TMT joint.

Soft Tissue: No fluid collection or hematoma.  No soft tissue mass.
IMPRESSION: 1. Moderate severity plantar fasciitis of the medial band of the
plantar fascia with a small partial-thickness tear and mild
subcortical reactive marrow edema.

## 2022-07-10 NOTE — Progress Notes (Unsigned)
Initial neurology clinic note  SERVICE DATE: 07/11/22  Reason for Evaluation: Consultation requested by Terri Lee., PA-C for an opinion regarding right arm numbness, tingling, and weakness. My final recommendations will be communicated back to the requesting physician by way of shared medical record or letter to requesting physician via [place] mail.  HPI: This is [name], a 56 y.o. right-handed female with a medical history of GERD, anxiety, current smoker, and low back pain who presents to neurology clinic with the chief complaint of right arm pain. The patient is alone.  Patient has had symptoms for 3-4 weeks. She was loading and unloading wood. She noticed the next day or 2 days later she had right elbow soreness. She then noticed pain going down into arm after 1 week. It was a sharp, shooting pain, like she hit her funny bone. About 1 week ago patient noticed pain into her shoulder. She feels like she is sore in her neck. She is always tense in her neck and has pain in it.  The pain is constant and can be 10/10. She is not sure if that arm is weak, because she is scared to use it. Patient went to urgent care on 07/08/22. She got toradol and solumedrol injections (07/08/22). She was put on a prednisone burst and asked to see neurology. She was also given muscle relaxer but has not taken yet because she is afraid to take because of going to work. The prednisone may have helped some with the pain during the day, but has not given great relief.  Of note, patient was seen by Dr. Lucia Lee at [facility] on 05/25/2019 for vertigo and diagnosed with BPPV. Patient had an MRI brain w/wo contrast that was essentially normal. She was sent to PT for symptoms. Patient is doing well with these symptoms.  Patient has never had symptoms like this in the past.  EtOH use: occasionally, 1-2 drinks  Restrictive diet? No Family history of neuropathy/myopathy/NM disease? No   MEDICATIONS:  Outpatient  Encounter Medications as of 07/11/2022  Medication Sig Note   aspirin-acetaminophen-caffeine (EXCEDRIN MIGRAINE) 250-250-65 MG per tablet Take 1 tablet by mouth daily as needed. For migraine not relieved by Ibuprofen    ibuprofen (ADVIL,MOTRIN) 200 MG tablet Take 400 mg by mouth 2 (two) times daily as needed. For headache    LORazepam (ATIVAN) 0.5 MG tablet Take 0.5 mg by mouth daily as needed.  01/12/2015: Received from: External Pharmacy   meloxicam (MOBIC) 15 MG tablet Take 1 tablet (15 mg total) by mouth daily.    ondansetron (ZOFRAN) 4 MG tablet Take 1 tablet (4 mg total) by mouth every 8 (eight) hours as needed.    pantoprazole (PROTONIX) 40 MG tablet Take 40 mg by mouth daily.    promethazine (PHENERGAN) 25 MG tablet Take 1 tablet (25 mg total) by mouth every 6 (six) hours as needed for nausea or vomiting.    [DISCONTINUED] FLUoxetine (PROZAC) 10 MG capsule Take by mouth daily. (Patient not taking: Reported on 07/11/2022)    No facility-administered encounter medications on file as of 07/11/2022.    PAST MEDICAL HISTORY: Past Medical History:  Diagnosis Date   Anxiety    Depression    No pertinent past medical history     PAST SURGICAL HISTORY: Past Surgical History:  Procedure Laterality Date   CESAREAN SECTION     DIAGNOSTIC LAPAROSCOPY     HYSTEROSCOPY WITH D & C  05/08/2012   Procedure: DILATATION AND CURETTAGE /HYSTEROSCOPY;  Surgeon: Meriel Pica, MD;  Location: [facility];  Service: Gynecology;  Laterality: N/A;  with Truclear   TUBAL LIGATION      ALLERGIES: No Known Allergies  FAMILY HISTORY: Family History  Problem Relation Age of Onset   Diabetes Father     SOCIAL HISTORY: Social History   Tobacco Use   Smoking status: Every Day    Packs/day: 0.50    Types: Cigarettes   Smokeless tobacco: Never  Vaping Use   Vaping Use: Never used  Substance Use Topics   Alcohol use: Yes    Comment: rarely   Drug use: No   Social History   Social History  Narrative   Lives alone   Right handed   Are you right handed or left handed? Right Handed   Are you currently employed ? 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 Yes   Who lives with you?    What type of home do you live in: 1 story or 2 story? Lives in a one story home          OBJECTIVE: PHYSICAL EXAM: BP (!) 159/91   Pulse 72   Ht 5\' 10"  (1.778 m)   Wt 217 lb (98.4 kg)   SpO2 99%   BMI 31.14 kg/m   General: General appearance: Awake and alert. No distress. Cooperative with exam.  Skin: No obvious rash or jaundice. HEENT: Atraumatic. Anicteric. Lungs: Non-labored breathing on room air  Extremities: No edema. No obvious deformity.  Musculoskeletal: No obvious joint swelling. Psych: Affect appropriate.  Neurological: Mental Status: Alert. Speech fluent. No pseudobulbar affect Cranial Nerves: CNII: No RAPD. Visual fields grossly intact. CNIII, IV, VI: PERRL. No nystagmus. EOMI. CN V: Facial sensation intact bilaterally to fine touch. CN VII: Facial muscles symmetric and strong. No ptosis at rest. CN VIII: Hearing grossly intact bilaterally. CN IX: No hypophonia. CN X: Palate elevates symmetrically. CN XI: Full strength shoulder shrug bilaterally. CN XII: Tongue protrusion full and midline. No atrophy or fasciculations. No significant dysarthria Motor: Tone is normal. No atrophy  Individual muscle group testing (MRC grade out of 5):  Movement     Neck flexion 5    Neck extension 5     Right Left   Shoulder abduction 5 5   Shoulder adduction 5 5   Shoulder ext rotation 5 5   Shoulder int rotation 5 5   Elbow flexion 5 5   Elbow extension 5 5   Wrist extension 5 5   Wrist flexion 5 5   Finger abduction - FDI 5 5   Finger abduction - ADM 5 5   Finger extension 5 5   Finger distal flexion - 2/3 5 5    Finger distal flexion - 4/5 5 5    Thumb flexion - FPL 5 5   Thumb abduction - APB 5 5    Hip flexion 5 5   Hip  extension 5 5   Hip adduction 5 5   Hip abduction 5 5   Knee extension 5 5   Knee flexion 5 5   Dorsiflexion 5 5   Plantarflexion 5 5     Reflexes:  Right Left   Bicep 2+ 2+   Tricep 2+ 2+   BrRad 2+ 2+   Knee 2+ 2+   Ankle 2+ 2+    Pathological Reflexes: Hoffman: absent bilaterally Troemner: absent bilaterally Sensation: Pinprick: Intact in all  extremities Tinel's test at carpal tunnel and elbow both negative Coordination: Intact finger-to- nose-finger bilaterally. Romberg negative. Gait: Able to rise from chair with arms crossed unassisted. Normal, narrow-based gait. Able to tandem walk. Able to walk on toes and heels.  Lab and Test Review: External labs: 03/13/22: Normal or unremarkable: CBC, CMP A1c (03/09/21): 5.5 TSH (03/03/19): 3.263  MRI brain w/wo contrast (06/03/2019): FINDINGS:    No abnormal lesions are seen on diffusion-weighted views to suggest acute ischemia. The cortical sulci, fissures and cisterns are normal in size and appearance. Lateral, third and fourth ventricle are normal in size and appearance. No extra-axial fluid collections are seen. No evidence of mass effect or midline shift.  2 small right frontal foci of non-specific gliosis. No abnormal lesions on post-contrast views.    On sagittal views the posterior fossa, pituitary gland and corpus callosum are unremarkable. No evidence of intracranial hemorrhage on gradient echo views. The orbits and their contents, paranasal sinuses and calvarium are unremarkable.  Intracranial flow voids are present. IMPRESSION:  MRI brain (with and without) demonstrating: - 2 small right frontal foci of non-specific gliosis. - No abnormal enhancing lesions. No acute findings.   ASSESSMENT: Terri Lee is a 56 y.o. female who presents for evaluation of right arm pain. She has a relevant medical history of GERD, anxiety, current smoker, and low back pain. Her neurological examination is essentially normal today. The  etiology of patient's symptoms is currently unclear. An ulnar neuropathy at the elbow or cervical radiculopathy is possible, but given normal exam, may not be likely. I will get an EMG to evaluate but also encouraged patient to see PCP as symptoms could be MSK in nature. I will also try gabapentin to see if this helps with symptom relief.  PLAN: -EMG - RUE (ulnar vs cervical radiculopathy) -Recommend seeing PCP to have MSK etiologies worked up -Continue lidocaine patches PRN  -Lidocaine cream PRN -Start gabapentin 300 mg TID, start with 300 mg qhs -Finish prednisone burst  -Return to clinic to be determined after testing  The impression above as well as the plan as outlined below were extensively discussed with the patient who voiced understanding. All questions were answered to their satisfaction.  When available, results of the above investigations and possible further recommendations will be communicated to the patient via telephone/MyChart. Patient to call office if not contacted after expected testing turnaround time.   Total time spent reviewing records, interview, history/exam, documentation, and coordination of care on day of encounter:  50 min   Thank you for allowing me to participate in patient's care.  If I can answer any additional questions, I would be pleased to do so.  Jacquelyne Balint, MD   CC: Terri Lee., PA-C [address] [place] [address]  CC: Referring provider: Richmond Lee., PA-C [address],  [place] [address]

## 2022-07-11 ENCOUNTER — Ambulatory Visit (INDEPENDENT_AMBULATORY_CARE_PROVIDER_SITE_OTHER): Payer: BC Managed Care – PPO | Admitting: Neurology

## 2022-07-11 ENCOUNTER — Encounter: Payer: Self-pay | Admitting: Neurology

## 2022-07-11 VITALS — BP 159/91 | HR 72 | Ht 70.0 in | Wt 217.0 lb

## 2022-07-11 DIAGNOSIS — R200 Anesthesia of skin: Secondary | ICD-10-CM

## 2022-07-11 DIAGNOSIS — M79601 Pain in right arm: Secondary | ICD-10-CM

## 2022-07-11 DIAGNOSIS — M25521 Pain in right elbow: Secondary | ICD-10-CM | POA: Diagnosis not present

## 2022-07-11 DIAGNOSIS — M542 Cervicalgia: Secondary | ICD-10-CM

## 2022-07-11 DIAGNOSIS — R202 Paresthesia of skin: Secondary | ICD-10-CM

## 2022-07-11 DIAGNOSIS — M25511 Pain in right shoulder: Secondary | ICD-10-CM

## 2022-07-11 MED ORDER — GABAPENTIN 300 MG PO CAPS: 300.0000 mg | ORAL_CAPSULE | Freq: Two times a day (BID) | ORAL | 11 refills | Status: AC

## 2022-07-11 MED ORDER — GABAPENTIN 300 MG PO CAPS: 300.0000 mg | ORAL_CAPSULE | Freq: Two times a day (BID) | ORAL | 11 refills | Status: DC

## 2022-07-11 NOTE — Patient Instructions (Signed)
I saw you today for right arm pain. I am not sure this is nerve related, but if it is, it could be coming from a nerve at the elbow or at the neck. I would like to get a muscle and nerve test called an EMG to help figure this out (see more information below).  I am starting a medication called gabapentin. You will take 1 pill (300 mg) at night for one week. If you are having no side effects, you can increase to twice a day.  You can also continue to try lidocaine patches or lidocaine cream that you buy over the counter.  I also recommend you see your PCP as this issue may not be a nerve problem and may be musculoskeletal problem that they should evaluate.  We will determine next steps after we get more information from these tests.  Please let me know if you have any questions or concerns in the meantime.  The physicians and staff at [facility] are committed to providing excellent care. You may receive a survey requesting feedback about your experience at our office. We strive to receive "very good" responses to the survey questions. If you feel that your experience would prevent you from giving the office a "very good " response, please contact our office to try to remedy the situation. We may be reached at [phone]. Thank you for taking the time out of your busy day to complete the survey.  Jacquelyne Balint, MD [facility]  ELECTROMYOGRAM AND NERVE CONDUCTION STUDIES (EMG/NCS) INSTRUCTIONS  How to Prepare The neurologist conducting the EMG will need to know if you have certain medical conditions. Tell the neurologist and other EMG lab personnel if you: Have a pacemaker or any other electrical medical device Take blood-thinning medications Have hemophilia, a blood-clotting disorder that causes prolonged bleeding Bathing Take a shower or bath shortly before your exam in order to remove oils from your skin. Don't apply lotions or creams before the exam.  What to Expect You'll  likely be asked to change into a hospital gown for the procedure and lie down on an examination table. The following explanations can help you understand what will happen during the exam.  Electrodes. The neurologist or a technician places surface electrodes at various locations on your skin depending on where you're experiencing symptoms. Or the neurologist may insert needle electrodes at different sites depending on your symptoms.  Sensations. The electrodes will at times transmit a tiny electrical current that you may feel as a twinge or spasm. The needle electrode may cause discomfort or pain that usually ends shortly after the needle is removed. If you are concerned about discomfort or pain, you may want to talk to the neurologist about taking a short break during the exam.  Instructions. During the needle EMG, the neurologist will assess whether there is any spontaneous electrical activity when the muscle is at rest - activity that isn't present in healthy muscle tissue - and the degree of activity when you slightly contract the muscle.  He or she will give you instructions on resting and contracting a muscle at appropriate times. Depending on what muscles and nerves the neurologist is examining, he or she may ask you to change positions during the exam.  After your EMG You may experience some temporary, minor bruising where the needle electrode was inserted into your muscle. This bruising should fade within several days. If it persists, contact your primary care doctor.

## 2022-07-16 ENCOUNTER — Telehealth: Payer: Self-pay | Admitting: Neurology

## 2022-07-16 ENCOUNTER — Ambulatory Visit (INDEPENDENT_AMBULATORY_CARE_PROVIDER_SITE_OTHER): Payer: BC Managed Care – PPO | Admitting: Neurology

## 2022-07-16 DIAGNOSIS — M79601 Pain in right arm: Secondary | ICD-10-CM | POA: Diagnosis not present

## 2022-07-16 DIAGNOSIS — M542 Cervicalgia: Secondary | ICD-10-CM

## 2022-07-16 DIAGNOSIS — R202 Paresthesia of skin: Secondary | ICD-10-CM

## 2022-07-16 NOTE — Procedures (Signed)
  [facility]  [address] [place], [address]  [place], [place] [address] Tel: [phone] Fax: [phone] Test Date:  07/16/2022  Patient: Terri Lee DOB: 1965/12/17 Physician: Jacquelyne Balint, MD  Sex: Female Height: 5\' 10"  Ref Phys: , MD  ID#: Jacquelyne Balint   Technician:    History: This is a 56 year old female with right arm pain.  NCV & EMG Findings: Extensive electrodiagnostic evaluation of the right upper limb shows: Right median, ulnar, and radial sensory responses are within normal limits. Right median (APB) and ulnar (ADM) motor responses are within normal limits. There is no evidence of active or chronic motor axon loss changes affecting any of the tested muscles on needle examination. Motor unit configuration and recruitment pattern is within normal limits.  Impression: This is a normal electrodiagnostic evaluation. Specifically: No electrodiagnostic evidence of a right cervical (C5-C8) motor radiculopathy. No electrodiagnostic evidence of a right median mononeuropathy. No electrodiagnostic evidence of a right ulnar mononeuropathy. No electrodiagnostic evidence of a right brachial plexopathy.   ___________________________ 41, MD    Nerve Conduction Studies Motor Nerve Results    Latency Amplitude F-Lat Segment Distance CV Comment  Site (ms) Norm (mV) Norm (ms)  (cm) (m/s) Norm   Right Median (APB) Motor  Wrist 3.3  < 4.0 10.5  > 6.0        Elbow 7.9 - 10.1 -  Elbow-Wrist 26.5 58  > 50   Right Ulnar (ADM) Motor  Wrist 2.0  < 3.1 10.9  > 7.0 28.0       Bel elbow 5.6 - 10.6 -  Bel elbow-Wrist 22 61  > 50   Ab elbow 7.3 - 10.3 -  Ab elbow-Bel elbow 10 59 -    Sensory Sites    Neg Peak Lat Amplitude (O-P) Segment Distance Velocity Comment  Site (ms) Norm (V) Norm  (cm) (ms)   Right Median Sensory  Wrist-Dig II 3.4  < 3.6 22  > 15 Wrist-Dig II 13    Right Radial Sensory  Forearm-Wrist 2.3  < 2.7 21  > 14 Forearm-Wrist 10    Right  Ulnar Sensory  Wrist-Dig V 2.8  < 3.1 19  > 10 Wrist-Dig V 11     Electromyography   Side Muscle Ins.Act Fibs Fasc Recrt Amp Dur Poly Activation Comment  Right FDI Nml Nml Nml Nml Nml Nml Nml Nml N/A  Right EIP Nml Nml Nml Nml Nml Nml Nml Nml N/A  Right FPL Nml Nml Nml Nml Nml Nml Nml Nml N/A  Right Pronator teres Nml Nml Nml Nml Nml Nml Nml Nml N/A  Right Biceps Nml Nml Nml Nml Nml Nml Nml Nml N/A  Right Triceps Nml Nml Nml Nml Nml Nml Nml Nml N/A  Right Deltoid Nml Nml Nml Nml Nml Nml Nml Nml N/A      Waveforms:  Motor      Sensory        F-Wave

## 2022-07-16 NOTE — Telephone Encounter (Signed)
Discussed the results of patient's EMG after the procedure today. EMG was normal. There is no current evidence of a nerve problem causing patient's right arm pain.  She will be seeing PCP today for xrays per patient's report. EMG is being sent to PCP for review as well.  All questions were answered.  Terri Balint, MD [facility]

## 2022-09-03 ENCOUNTER — Encounter: Payer: BC Managed Care – PPO | Admitting: Neurology
